# Patient Record
Sex: Female | Born: 1950 | Race: White | Hispanic: No | Marital: Married | Smoking: Current every day smoker
Health system: Southern US, Community
[De-identification: ages and names within clinical notes are randomized; demographics above are authoritative.]

## PROBLEM LIST (undated history)

## (undated) DIAGNOSIS — M8580 Other specified disorders of bone density and structure, unspecified site: Secondary | ICD-10-CM

## (undated) DIAGNOSIS — N200 Calculus of kidney: Secondary | ICD-10-CM

## (undated) DIAGNOSIS — E7800 Pure hypercholesterolemia, unspecified: Secondary | ICD-10-CM

## (undated) DIAGNOSIS — T8859XA Other complications of anesthesia, initial encounter: Secondary | ICD-10-CM

## (undated) DIAGNOSIS — F329 Major depressive disorder, single episode, unspecified: Secondary | ICD-10-CM

## (undated) DIAGNOSIS — E119 Type 2 diabetes mellitus without complications: Secondary | ICD-10-CM

## (undated) DIAGNOSIS — T4145XA Adverse effect of unspecified anesthetic, initial encounter: Secondary | ICD-10-CM

## (undated) DIAGNOSIS — Z9889 Other specified postprocedural states: Secondary | ICD-10-CM

## (undated) DIAGNOSIS — R112 Nausea with vomiting, unspecified: Secondary | ICD-10-CM

## (undated) DIAGNOSIS — I739 Peripheral vascular disease, unspecified: Secondary | ICD-10-CM

## (undated) DIAGNOSIS — I10 Essential (primary) hypertension: Secondary | ICD-10-CM

## (undated) HISTORY — PX: DILATION AND CURETTAGE OF UTERUS: SHX78

## (undated) HISTORY — DX: Other specified disorders of bone density and structure, unspecified site: M85.80

---
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
## 1979-07-07 HISTORY — PX: BREAST BIOPSY: SHX20

## 1979-07-07 HISTORY — PX: BREAST CYST EXCISION: SHX579

## 1993-07-06 DIAGNOSIS — F32A Depression, unspecified: Secondary | ICD-10-CM

## 1993-07-06 HISTORY — DX: Depression, unspecified: F32.A

## 1995-07-07 HISTORY — PX: ANTERIOR CERVICAL DECOMP/DISCECTOMY FUSION: SHX1161

## 1999-08-27 ENCOUNTER — Other Ambulatory Visit: Admission: RE | Admit: 1999-08-27 | Discharge: 1999-08-27 | Payer: Self-pay | Admitting: Obstetrics and Gynecology

## 2000-10-19 ENCOUNTER — Other Ambulatory Visit: Admission: RE | Admit: 2000-10-19 | Discharge: 2000-10-19 | Payer: Self-pay | Admitting: Obstetrics and Gynecology

## 2001-01-12 ENCOUNTER — Other Ambulatory Visit: Admission: RE | Admit: 2001-01-12 | Discharge: 2001-01-12 | Payer: Self-pay | Admitting: Obstetrics and Gynecology

## 2001-01-12 ENCOUNTER — Encounter (INDEPENDENT_AMBULATORY_CARE_PROVIDER_SITE_OTHER): Payer: Self-pay | Admitting: Specialist

## 2002-02-01 ENCOUNTER — Other Ambulatory Visit: Admission: RE | Admit: 2002-02-01 | Discharge: 2002-02-01 | Payer: Self-pay | Admitting: Obstetrics and Gynecology

## 2002-09-04 ENCOUNTER — Ambulatory Visit (HOSPITAL_COMMUNITY): Admission: RE | Admit: 2002-09-04 | Discharge: 2002-09-04 | Payer: Self-pay | Admitting: Obstetrics and Gynecology

## 2002-09-04 ENCOUNTER — Encounter: Payer: Self-pay | Admitting: Obstetrics and Gynecology

## 2002-09-12 ENCOUNTER — Encounter: Admission: RE | Admit: 2002-09-12 | Discharge: 2002-12-11 | Payer: Self-pay | Admitting: Family Medicine

## 2002-11-06 ENCOUNTER — Encounter: Payer: Self-pay | Admitting: Family Medicine

## 2002-11-06 ENCOUNTER — Encounter: Admission: RE | Admit: 2002-11-06 | Discharge: 2002-11-06 | Payer: Self-pay | Admitting: Family Medicine

## 2002-11-09 ENCOUNTER — Encounter: Admission: RE | Admit: 2002-11-09 | Discharge: 2002-11-09 | Payer: Self-pay | Admitting: Family Medicine

## 2002-11-09 ENCOUNTER — Encounter: Payer: Self-pay | Admitting: Family Medicine

## 2003-03-07 ENCOUNTER — Other Ambulatory Visit: Admission: RE | Admit: 2003-03-07 | Discharge: 2003-03-07 | Payer: Self-pay | Admitting: Obstetrics and Gynecology

## 2003-08-30 ENCOUNTER — Ambulatory Visit (HOSPITAL_BASED_OUTPATIENT_CLINIC_OR_DEPARTMENT_OTHER): Admission: RE | Admit: 2003-08-30 | Discharge: 2003-08-30 | Payer: Self-pay | Admitting: Obstetrics and Gynecology

## 2003-08-30 ENCOUNTER — Ambulatory Visit (HOSPITAL_COMMUNITY): Admission: RE | Admit: 2003-08-30 | Discharge: 2003-08-30 | Payer: Self-pay | Admitting: Obstetrics and Gynecology

## 2003-08-30 ENCOUNTER — Encounter (INDEPENDENT_AMBULATORY_CARE_PROVIDER_SITE_OTHER): Payer: Self-pay | Admitting: Specialist

## 2007-03-10 ENCOUNTER — Other Ambulatory Visit: Admission: RE | Admit: 2007-03-10 | Discharge: 2007-03-10 | Payer: Self-pay | Admitting: Obstetrics and Gynecology

## 2009-01-25 ENCOUNTER — Encounter: Admission: RE | Admit: 2009-01-25 | Discharge: 2009-01-25 | Payer: Self-pay | Admitting: Otolaryngology

## 2009-04-29 ENCOUNTER — Emergency Department (HOSPITAL_COMMUNITY): Admission: EM | Admit: 2009-04-29 | Discharge: 2009-04-30 | Payer: Self-pay | Admitting: Emergency Medicine

## 2009-07-02 ENCOUNTER — Other Ambulatory Visit: Admission: RE | Admit: 2009-07-02 | Discharge: 2009-07-02 | Payer: Self-pay | Admitting: Obstetrics and Gynecology

## 2009-07-02 ENCOUNTER — Ambulatory Visit: Payer: Self-pay | Admitting: Obstetrics and Gynecology

## 2009-09-02 ENCOUNTER — Ambulatory Visit: Payer: Self-pay | Admitting: Obstetrics and Gynecology

## 2010-02-05 ENCOUNTER — Emergency Department (HOSPITAL_COMMUNITY): Admission: EM | Admit: 2010-02-05 | Discharge: 2010-02-05 | Payer: Self-pay | Admitting: Emergency Medicine

## 2010-02-24 ENCOUNTER — Ambulatory Visit (HOSPITAL_COMMUNITY): Admission: RE | Admit: 2010-02-24 | Discharge: 2010-02-24 | Payer: Self-pay | Admitting: Urology

## 2010-07-06 DIAGNOSIS — N200 Calculus of kidney: Secondary | ICD-10-CM

## 2010-07-06 HISTORY — PX: LITHOTRIPSY: SUR834

## 2010-07-06 HISTORY — DX: Calculus of kidney: N20.0

## 2010-07-27 ENCOUNTER — Encounter: Payer: Self-pay | Admitting: Urology

## 2010-09-18 LAB — GLUCOSE, CAPILLARY: Glucose-Capillary: 100 mg/dL — ABNORMAL HIGH (ref 70–99)

## 2010-09-19 LAB — CBC
HCT: 38.4 % (ref 36.0–46.0)
Hemoglobin: 13.3 g/dL (ref 12.0–15.0)
MCHC: 34.7 g/dL (ref 30.0–36.0)
RBC: 4.11 MIL/uL (ref 3.87–5.11)

## 2010-09-19 LAB — DIFFERENTIAL
Basophils Relative: 1 % (ref 0–1)
Lymphs Abs: 1 10*3/uL (ref 0.7–4.0)
Monocytes Absolute: 0.5 10*3/uL (ref 0.1–1.0)
Monocytes Relative: 5 % (ref 3–12)
Neutro Abs: 9.8 10*3/uL — ABNORMAL HIGH (ref 1.7–7.7)

## 2010-09-19 LAB — URINE MICROSCOPIC-ADD ON

## 2010-09-19 LAB — URINALYSIS, ROUTINE W REFLEX MICROSCOPIC
Glucose, UA: 250 mg/dL — AB
Ketones, ur: 15 mg/dL — AB
Nitrite: NEGATIVE
Specific Gravity, Urine: 1.021 (ref 1.005–1.030)
pH: 6.5 (ref 5.0–8.0)

## 2010-09-19 LAB — BASIC METABOLIC PANEL
CO2: 23 mEq/L (ref 19–32)
GFR calc Af Amer: >60 mL/min (ref >60)
GFR calc non Af Amer: 56 mL/min — ABNORMAL LOW (ref >60)
Glucose, Bld: 226 mg/dL — ABNORMAL HIGH (ref 70–99)
Potassium: 4.5 mEq/L (ref 3.5–5.1)
Sodium: 137 mEq/L (ref 135–145)

## 2010-09-19 LAB — GLUCOSE, CAPILLARY

## 2010-11-21 NOTE — Op Note (Signed)
NAME:  Yvette Graham, Yvette Graham                         ACCOUNT NO.:  [id number]   MEDICAL RECORD NO.:  [id number]                   PATIENT TYPE:  AMB   LOCATION:  [facility]                                 FACILITY:  [facility]   PHYSICIAN:  Daniel L. Eda Paschal, M.D.           DATE OF BIRTH:  Nov 25, 1950   DATE OF PROCEDURE:  08/30/2003  DATE OF DISCHARGE:                                 OPERATIVE REPORT   PREOPERATIVE DIAGNOSES:  1. Postmenopausal bleeding.  2. Endometrial polyp.   POSTOPERATIVE DIAGNOSES:  1. Postmenopausal bleeding.  2. Endometrial polyp.   OPERATION/PROCEDURE:  Hysterectomy and dilatation and curettage.   SURGEON:  Daniel L. Eda Paschal, M.D.   ANESTHESIA:  General.   INDICATIONS:  The patient is a 60 year old female who has been  postmenopausal for a year with an elevated FSH, secondary amenorrhea. Then  about eight weeks ago she started to bleed and she has been bleeding ever  since.  Saline infusion histogram in the office showed several intrauterine  cavitary defects.  She enters the hospital now for hysteroscopy and  dilatation and curettage for the above findings.  External and vaginal was  normal.  Cervix is clean.  Uterus is retroverted, normal size and shape.  There is possibly first degree uterine descensus.  Adnexa are palpably  normal.  At the time of hysteroscopy, the patient had multiple small  endometrial polyps.  None were larger than 1 cm.  They were on both lateral  fundal walls.  Once they had been removed, top of the fundus, anterior and  posterior walls of the fundus, tubal ostia, lower uterine segment, and the  cervical canal were all free of disease.   DESCRIPTION OF PROCEDURE:  After adequate general anesthesia, the patient  was placed in the dorsal lithotomy position, prepped and draped in the usual  sterile manner.  A single-tooth tenaculum was placed in the anterior lip of  the cervix.  The cervix was dilated to a #33 Pratt dilator.  A  hysteroscopic  resectoscope was utilized using 3% sorbitol to expand the intrauterine  cavity and the camera for magnification.  Initially there was some  compromise of the view.  Because there was a fair bit of loose fragments in  the endometrium, some of which were probably blood from the recent bleeding,  some of which appeared to be fragments of a polyp.  Therefore, the  hysteroscope was removed and a sharp curettage was done, removing the above  tissue.  The hysteroscope was then reinserted.  A much better view could now  be obtained as the cavity was cleaner.  Using a 90-degree wire loupe with  setting of coag 110 cutting, the several fragments of polypoid tissue that  were still left were excised.  There was now no bleeding.  The procedure was  terminated.  Blood loss was less than 25 mL.  Fluid deficit was 50 mL.  The  patient tolerated  the procedure well and left the operating room in  satisfactory condition.                                               Daniel L. Eda Paschal, M.D.   Tonette Bihari  D:  08/30/2003  T:  08/30/2003  Job:  [id number]

## 2011-06-28 IMAGING — CT CT ABD-PELV W/O CM
2 of 4 series · 17 of 46 positions shown, 19 images · non-contrast
Comparison: None

CLINICAL DATA: Flank pain

CT ABDOMEN AND PELVIS WITHOUT CONTRAST
TECHNIQUE: Multidetector CT imaging of the abdomen and pelvis was
performed following the standard protocol without intravenous
contrast.

[Series 2: under 200# stone no prev · axial · 0.60mm/px · z∈[+760,+1090]mm · 14 of 72 slices shown, 16 images]
[im 3/72  soft-tissue]
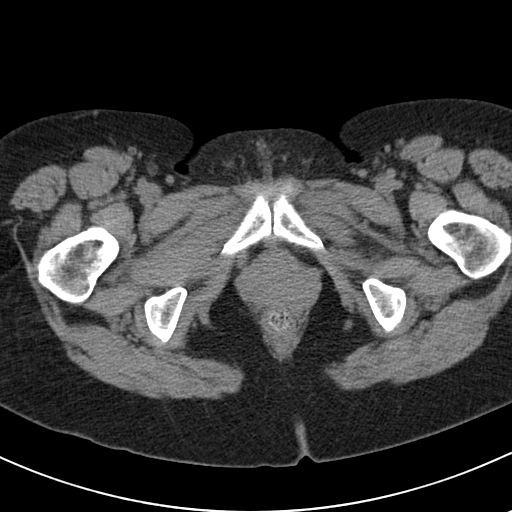
[im 3/72  bone]
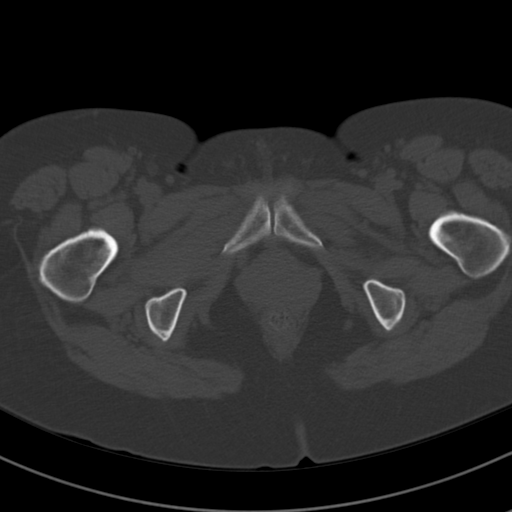
[im 9/72  soft-tissue]
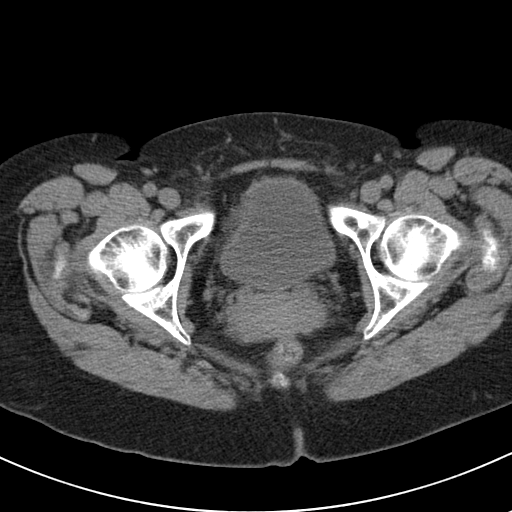
[im 15/72  soft-tissue]
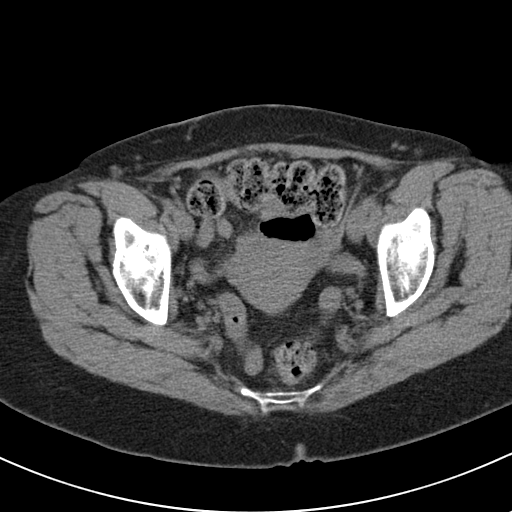
[im 18/72  soft-tissue]
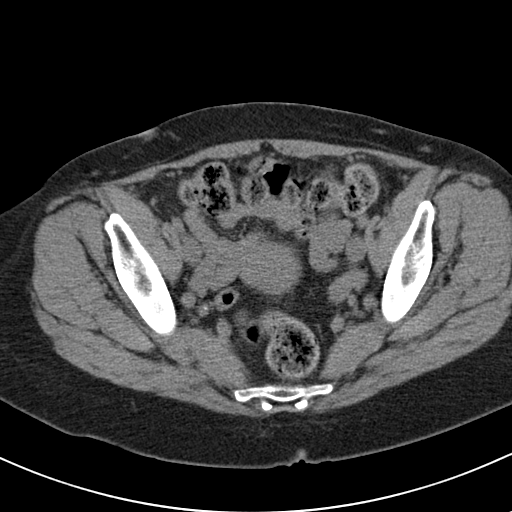
[im 24/72  soft-tissue]
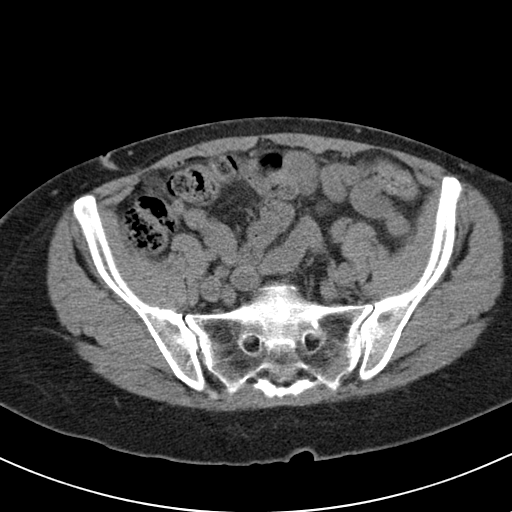
[im 30/72  soft-tissue]
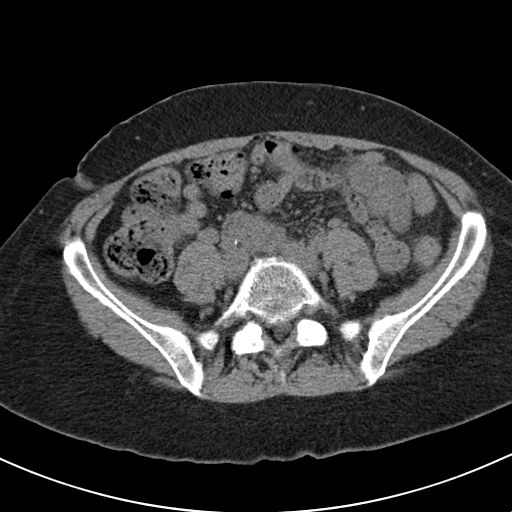
[im 33/72  soft-tissue]
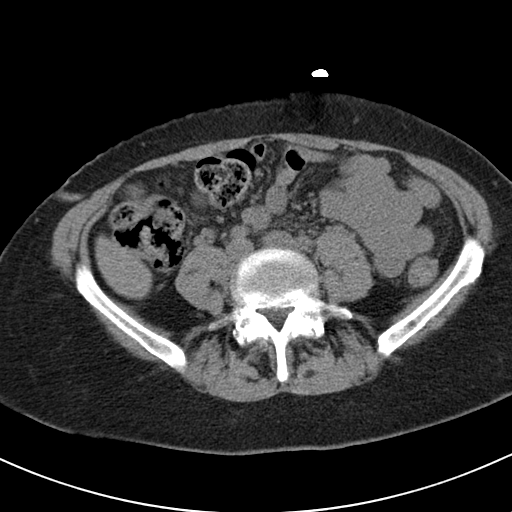
[im 39/72  soft-tissue]
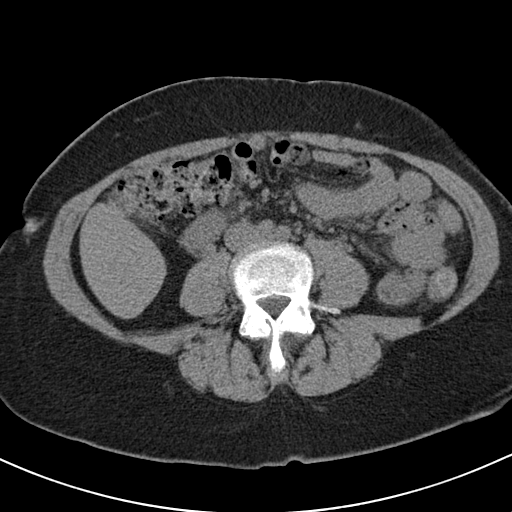
[im 42/72  soft-tissue]
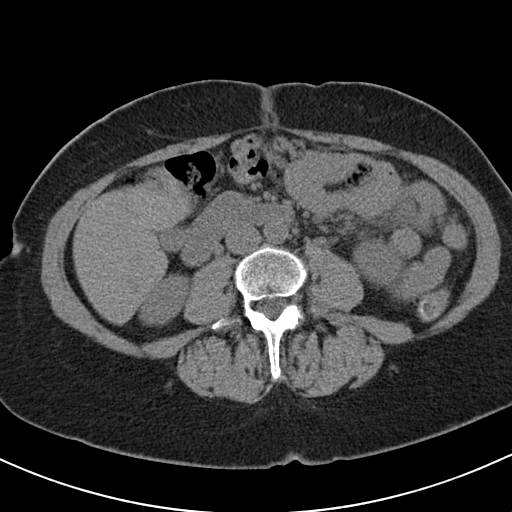
[im 42/72  bone]
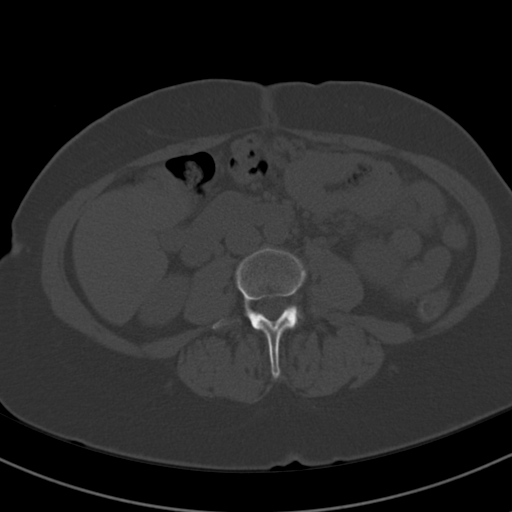
[im 48/72  soft-tissue]
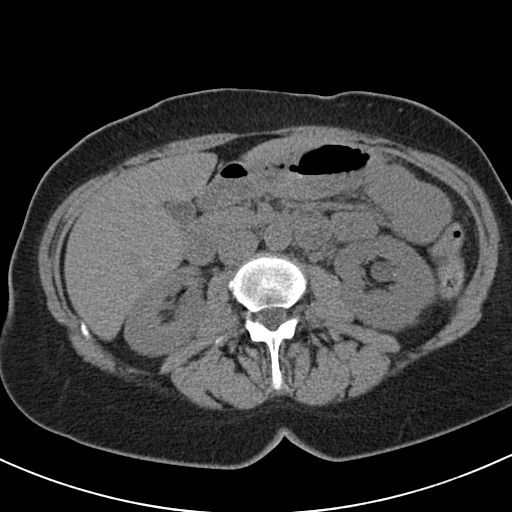
[im 54/72  soft-tissue]
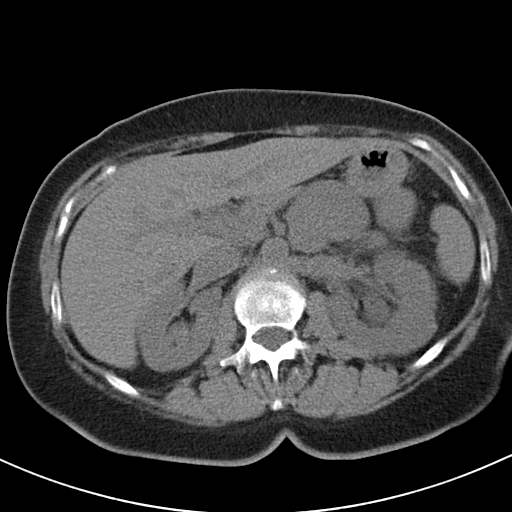
[im 57/72  soft-tissue]
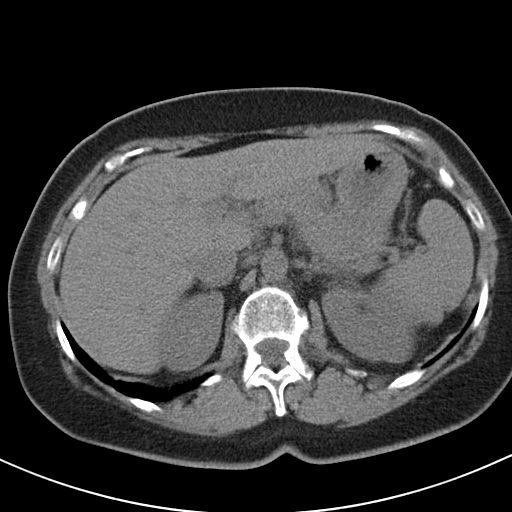
[im 63/72  soft-tissue]
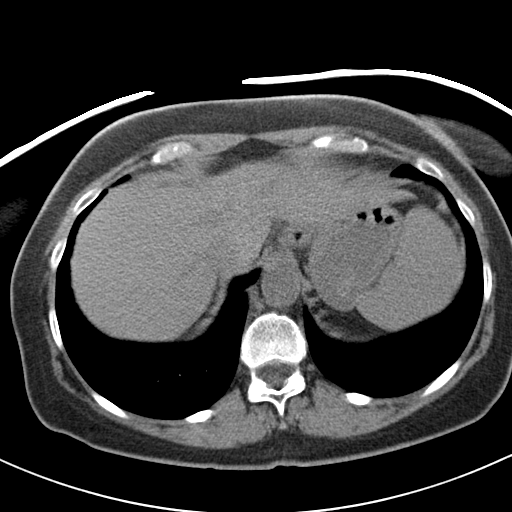
[im 69/72  soft-tissue]
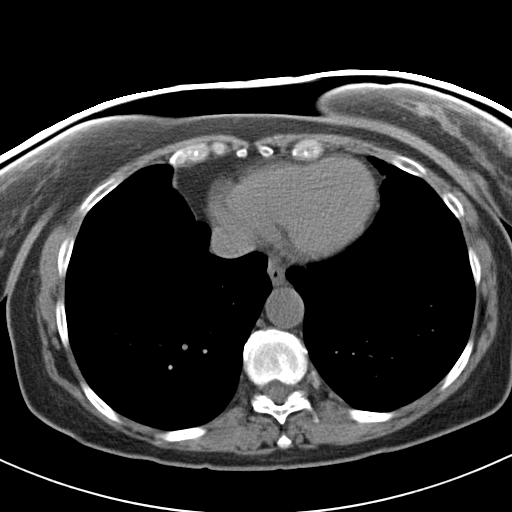

[Series 602: <mpr thick range> · coronal · 0.74mm/px · 3 of 64 slices shown]
[im 22/64  soft-tissue]
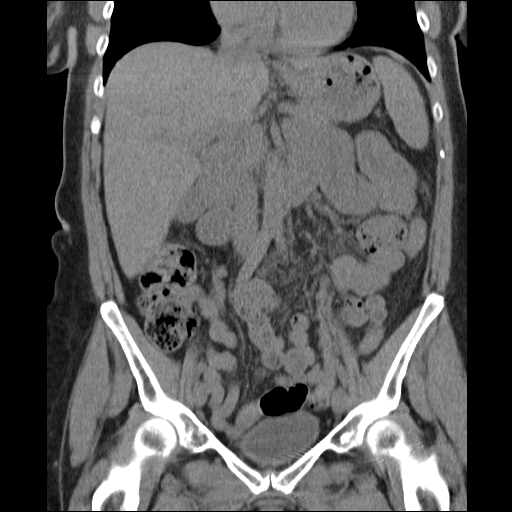
[im 29/64  soft-tissue]
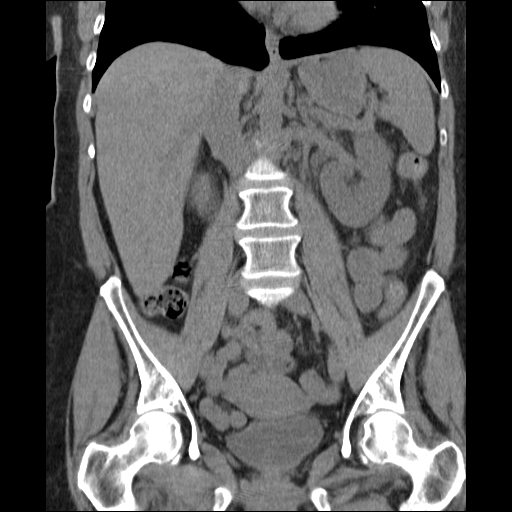
[im 36/64  soft-tissue]
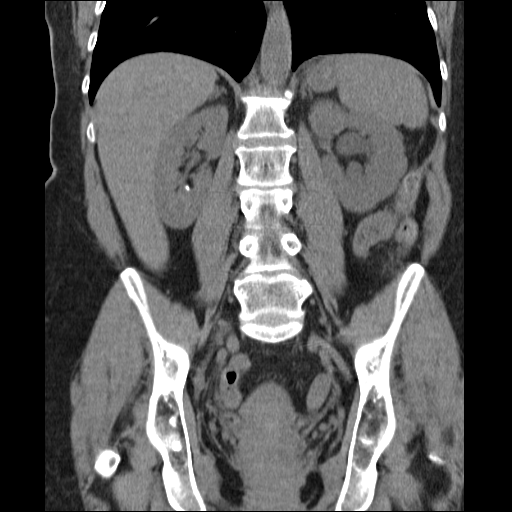

[17 of 46 positions shown; findings below may reference images not displayed]

FINDINGS: The lung bases appear clear.

No focal liver abnormalities identified.

The spleen is normal.

The adrenal glands are normal.

Pancreas appears normal.

There are two small nonobstructing stones within the inferior pole
collecting system of the right kidney which measure 3 mm. There are
several punctate stones within the collecting system of the left
kidney.  Moderate left hydronephrosis and hydroureter is noted.  At
the left UPJ there is a 5.7 mm stone.

There is no enlarged upper abdominal lymph nodes.

There is no free fluid or abnormal fluid collections identified.

The upper abdominal bowel loops have a normal course and caliber.
There is no evidence for obstruction.

There is no free fluid within the pelvis.  The urinary bladder is
normal.  The uterus and adnexal structures are unremarkable.

The urinary bladder appears normal.

Review of the visualized osseous structures shows no aggressive
bone lesions.  There is multilevel degenerative disc disease.
IMPRESSION: 1.  Left-sided hydronephrosis secondary to 5.7 mm left UPJ stone.
2.  Bilateral nephrolithiasis.

## 2011-11-16 ENCOUNTER — Ambulatory Visit (INDEPENDENT_AMBULATORY_CARE_PROVIDER_SITE_OTHER): Payer: BC Managed Care – PPO | Admitting: Vascular Surgery

## 2011-11-16 ENCOUNTER — Emergency Department (HOSPITAL_COMMUNITY)
Admission: EM | Admit: 2011-11-16 | Discharge: 2011-11-16 | Disposition: A | Discharge status: Home / Self Care | Payer: BC Managed Care – PPO | Attending: Emergency Medicine | Admitting: Emergency Medicine

## 2011-11-16 ENCOUNTER — Emergency Department (HOSPITAL_COMMUNITY): Payer: BC Managed Care – PPO

## 2011-11-16 DIAGNOSIS — R112 Nausea with vomiting, unspecified: Secondary | ICD-10-CM | POA: Insufficient documentation

## 2011-11-16 DIAGNOSIS — Z794 Long term (current) use of insulin: Secondary | ICD-10-CM | POA: Insufficient documentation

## 2011-11-16 DIAGNOSIS — R55 Syncope and collapse: Secondary | ICD-10-CM

## 2011-11-16 DIAGNOSIS — R259 Unspecified abnormal involuntary movements: Secondary | ICD-10-CM | POA: Insufficient documentation

## 2011-11-16 DIAGNOSIS — R4182 Altered mental status, unspecified: Secondary | ICD-10-CM | POA: Insufficient documentation

## 2011-11-16 DIAGNOSIS — Z7982 Long term (current) use of aspirin: Secondary | ICD-10-CM | POA: Insufficient documentation

## 2011-11-16 DIAGNOSIS — R42 Dizziness and giddiness: Secondary | ICD-10-CM | POA: Insufficient documentation

## 2011-11-16 DIAGNOSIS — F29 Unspecified psychosis not due to a substance or known physiological condition: Secondary | ICD-10-CM | POA: Insufficient documentation

## 2011-11-16 DIAGNOSIS — R51 Headache: Secondary | ICD-10-CM | POA: Insufficient documentation

## 2011-11-16 DIAGNOSIS — Z79899 Other long term (current) drug therapy: Secondary | ICD-10-CM | POA: Insufficient documentation

## 2011-11-16 LAB — CBC
HCT: 39.7 % (ref 36.0–46.0)
MCV: 91.9 fL (ref 78.0–100.0)
RBC: 4.32 MIL/uL (ref 3.87–5.11)
RDW: 13 % (ref 11.5–15.5)
WBC: 7.1 10*3/uL (ref 4.0–10.5)

## 2011-11-16 LAB — COMPREHENSIVE METABOLIC PANEL
ALT: 18 U/L (ref 0–35)
AST: 24 U/L (ref 0–37)
CO2: 24 mEq/L (ref 19–32)
Calcium: 9.5 mg/dL (ref 8.4–10.5)
Chloride: 105 mEq/L (ref 96–112)
Creatinine, Ser: 0.63 mg/dL (ref 0.50–1.10)
GFR calc Af Amer: >90 mL/min (ref >90)
GFR calc non Af Amer: >90 mL/min (ref >90)
Glucose, Bld: 120 mg/dL — ABNORMAL HIGH (ref 70–99)
Sodium: 140 mEq/L (ref 135–145)
Total Bilirubin: 0.3 mg/dL (ref 0.3–1.2)

## 2011-11-16 LAB — URINE MICROSCOPIC-ADD ON

## 2011-11-16 LAB — URINALYSIS, ROUTINE W REFLEX MICROSCOPIC
Bilirubin Urine: NEGATIVE
Glucose, UA: NEGATIVE mg/dL
Hgb urine dipstick: NEGATIVE
Specific Gravity, Urine: 1.007 (ref 1.005–1.030)

## 2011-11-16 LAB — DIFFERENTIAL
Basophils Absolute: 0 10*3/uL (ref 0.0–0.1)
Eosinophils Relative: 1 % (ref 0–5)
Lymphocytes Relative: 18 % (ref 12–46)
Lymphs Abs: 1.3 10*3/uL (ref 0.7–4.0)
Monocytes Absolute: 0.5 10*3/uL (ref 0.1–1.0)
Neutro Abs: 5.2 10*3/uL (ref 1.7–7.7)

## 2011-11-16 LAB — APTT: aPTT: 29 seconds (ref 24–37)

## 2011-11-16 MED ORDER — ASPIRIN EC 325 MG PO TBEC: 325.0000 mg | DELAYED_RELEASE_TABLET | Freq: Every day | ORAL | Status: AC

## 2011-11-16 NOTE — ED Provider Notes (Signed)
History     CSN: [id number]  Arrival date & time 11/16/11  0705   First MD Initiated Contact with Patient 11/16/11 [phone]      Chief Complaint  Patient presents with  . Stroke Symptoms    (Consider location/radiation/quality/duration/timing/severity/associated sxs/prior treatment) The history is provided by the patient.   patient states that she woke up at 5:30 this morning confused stumbling and dizzy. She states she looked over at her husband but did not recognize him. She states she was unable to walk. She states nausea came later. She did have a headache. Patient now feels much better. She had a episode of similar symptoms on Friday. She states that time she was incontinent of stool. She does not remember most of the day on Friday. She saw her primary care Dr. Neale Burly, U. ordered lab work and an MRI. The MRI was reportedly negative. The family states that he was going to arrange to get her carotids imaged by vascular surgery. Her blood sugar was in the 80s when checked during the episode. No fevers. No localizing numbness or weakness. She was on the steady on her feet.  No past medical history on file.  No past surgical history on file.  No family history on file.  History  Substance Use Topics  . Smoking status: Not on file  . Smokeless tobacco: Not on file  . Alcohol Use: Not on file    OB History    No data available      Review of Systems  Constitutional: Negative for activity change and appetite change.  HENT: Negative for neck stiffness.   Eyes: Negative for pain.  Respiratory: Negative for chest tightness and shortness of breath.   Cardiovascular: Negative for chest pain and leg swelling.  Gastrointestinal: Positive for nausea and vomiting. Negative for abdominal pain and diarrhea.  Genitourinary: Negative for flank pain.  Musculoskeletal: Negative for back pain.  Skin: Negative for rash.  Neurological: Positive for dizziness and headaches. Negative for speech  difficulty, weakness and numbness.  Psychiatric/Behavioral: Positive for confusion. Negative for behavioral problems.    Allergies  Codeine  Home Medications   Current Outpatient Rx  Name Route Sig Dispense Refill  . VYTORIN PO Oral Take 1 tablet by mouth at bedtime. Dose is 20/40    . INSULIN GLARGINE 100 UNIT/ML [place] SOLN Subcutaneous Inject 20 Units into the skin at bedtime.    . INSULIN LISPRO (HUMAN) 100 UNIT/ML [place] SOLN Subcutaneous Inject 6-22 Units into the skin 3 (three) times daily before meals. Sliding scale    . LISINOPRIL 10 MG PO TABS Oral Take 10 mg by mouth at bedtime.    Marland Kitchen PIOGLITAZONE HCL 30 MG PO TABS Oral Take 30 mg by mouth at bedtime.    . ASPIRIN EC 325 MG PO TBEC Oral Take 1 tablet (325 mg total) by mouth daily. 30 tablet 0    BP 130/54  Pulse 70  Temp(Src) 97.8 F (36.6 C) (Oral)  Resp 16  SpO2 97%  Physical Exam  Nursing note and vitals reviewed. Constitutional: She is oriented to person, place, and time. She appears well-developed and well-nourished.  HENT:  Head: Normocephalic and atraumatic.  Eyes: EOM are normal. Pupils are equal, round, and reactive to light.  Neck: Normal range of motion. Neck supple.       No carotid bruits by auscultation  Cardiovascular: Normal rate, regular rhythm and normal heart sounds.   No murmur heard. Pulmonary/Chest: Effort normal and breath sounds normal.  No respiratory distress. She has no wheezes. She has no rales.  Abdominal: Soft. Bowel sounds are normal. She exhibits no distension. There is no tenderness. There is no rebound and no guarding.  Musculoskeletal: Normal range of motion.  Neurological: She is alert and oriented to person, place, and time. No cranial nerve deficit.       Grip strength is equal bilaterally. Patient does have a little bit of a tremor, but patient states is because she is cold. Finger-nose and heel shin are intact bilaterally.  Skin: Skin is warm and dry.  Psychiatric: She has a normal  mood and affect. Her speech is normal.    ED Course  Procedures (including critical care time)  Labs Reviewed  COMPREHENSIVE METABOLIC PANEL - Abnormal; Notable for the following:    Glucose, Bld 120 (*)    All other components within normal limits  URINALYSIS, ROUTINE W REFLEX MICROSCOPIC - Abnormal; Notable for the following:    Leukocytes, UA TRACE (*)    All other components within normal limits  PROTIME-INR  APTT  CBC  DIFFERENTIAL  CK TOTAL AND CKMB  TROPONIN I  URINE MICROSCOPIC-ADD ON   Dg Chest 2 View  11/16/2011  *RADIOLOGY REPORT*  Clinical Data: Confusion  CHEST - 2 VIEW  Comparison: None.  Findings: Normal heart size.  Clear lungs.  Increased AP diameter of the chest.  Bronchitic changes.  No pneumothorax and no pleural effusion.  IMPRESSION: Findings related to COPD are noted but there is no active cardiopulmonary disease.  Original Report Authenticated By: Donavan Burnet, M.D.     1. Altered mental status     Date: 11/16/2011  Rate: 74  Rhythm: normal sinus rhythm  QRS Axis: normal  Intervals: normal  ST/T Wave abnormalities: normal  Conduction Disutrbances:none  Narrative Interpretation:   Old EKG Reviewed: unchanged     MDM  Patient presented today with a second episode of altered mental status. She was at home and had dizziness confusion. Blood pressure was reportedly initially high. Patient is back to her baseline now. Her sugar was not low with the event. She was seen by her primary care Dr. Neale Burly on Friday and had an MRI. I discussed with Dr. Jarold Motto, who reviewed the results with me. After discussion with Dr. Jarold Motto and Dr. Elita Boone, the neurologist on call. Decision was made to have the patient followup for a carotid duplex as an outpatient as planned. She'll also follow with neurology. Her aspirin will be increased to a full dose. Patient was instructed not to drive since seizures still on the differential.        Juliet Rude. Rubin Payor,  MD 11/16/11 [phone]

## 2011-11-16 NOTE — Discharge Instructions (Signed)
Altered Mental Status Altered mental status most often refers to an abnormal change in your responsiveness and awareness. It can affect your speech, thought, mobility, memory, attention span, or alertness. It can range from slight confusion to complete unresponsiveness (coma). Altered mental status can be a sign of a serious underlying medical condition. Rapid evaluation and medical treatment is necessary for patients having an altered mental status. CAUSES   Low blood sugar (hypoglycemia) or diabetes.   Severe loss of body fluids (dehydration) or a body salt (electrolyte) imbalance.   A stroke or other neurologic problem, such as dementia or delirium.   A head injury or tumor.   A drug or alcohol overdose.   Exposure to toxins or poisons.   Depression, anxiety, and stress.   A low oxygen level (hypoxia).   An infection.   Blood loss.   Twitching or shaking (seizure).   Heart problems, such as heart attack or heart rhythm problems (arrhythmias).   A body temperature that is too low or too high (hypothermia or hyperthermia).  DIAGNOSIS  A diagnosis is based on your history, symptoms, physical and neurologic examinations, and diagnostic tests. Diagnostic tests may include:  Measurement of your blood pressure, pulse, breathing, and oxygen levels (vital signs).   Blood tests.   Urine tests.   X-ray exams.   A computerized magnetic scan (magnetic resonance imaging, MRI).   A computerized X-ray scan (computed tomography, CT scan).  TREATMENT  Treatment will depend on the cause. Treatment may include:  Management of an underlying medical or mental health condition.   Critical care or support in the hospital.  HOME CARE INSTRUCTIONS   Only take over-the-counter or prescription medicines for pain, discomfort, or fever as directed by your caregiver.   Manage underlying conditions as directed by your caregiver.   Eat a healthy, well-balanced diet to maintain strength.    Join a support group or prevention program to cope with the condition or trauma that caused the altered mental status. Ask your caregiver to help choose a program that works for you.   Follow up with your caregiver for further examination, therapy, or testing as directed.  SEEK MEDICAL CARE IF:   You feel unwell or have chills.   You or your family notice a change in your behavior or your alertness.   You have trouble following your caregiver's treatment plan.   You have questions or concerns.  SEEK IMMEDIATE MEDICAL CARE IF:   You have a rapid heartbeat or have chest pain.   You have difficulty breathing.   You have a fever.   You have a headache with a stiff neck.   You cough up blood.   You have blood in your urine or stool.   You have severe agitation or confusion.  MAKE SURE YOU:   Understand these instructions.   Will watch your condition.   Will get help right away if you are not doing well or get worse.  Document Released: 12/10/2009 Document Revised: 06/11/2011 Document Reviewed: 12/10/2009 Exit[employer] Patient Information 2012 [employer]. 

## 2011-11-16 NOTE — ED Notes (Signed)
Pt awoke this morning at 05:30 with confusion and stumbling. She did not know who her husband. Same symptoms as last Friday. Dr. Is supposed to be setting up an appointment to have carotids looked at. C/o Headache, high blood pressure dizziness, Unsteady on her feet. Currently Alert oriented. Last seen normal 23:00 last night.

## 2011-11-17 ENCOUNTER — Encounter (HOSPITAL_COMMUNITY): Payer: Self-pay

## 2011-11-17 ENCOUNTER — Observation Stay (HOSPITAL_COMMUNITY)
Admission: EM | Admit: 2011-11-17 | Discharge: 2011-11-19 | Disposition: A | Discharge status: Home / Self Care | Payer: BC Managed Care – PPO | Source: Ambulatory Visit | Attending: Internal Medicine | Admitting: Internal Medicine

## 2011-11-17 DIAGNOSIS — R4182 Altered mental status, unspecified: Principal | ICD-10-CM | POA: Insufficient documentation

## 2011-11-17 DIAGNOSIS — E119 Type 2 diabetes mellitus without complications: Secondary | ICD-10-CM | POA: Insufficient documentation

## 2011-11-17 DIAGNOSIS — E785 Hyperlipidemia, unspecified: Secondary | ICD-10-CM | POA: Insufficient documentation

## 2011-11-17 DIAGNOSIS — E7800 Pure hypercholesterolemia, unspecified: Secondary | ICD-10-CM | POA: Insufficient documentation

## 2011-11-17 DIAGNOSIS — I10 Essential (primary) hypertension: Secondary | ICD-10-CM | POA: Insufficient documentation

## 2011-11-17 DIAGNOSIS — R159 Full incontinence of feces: Secondary | ICD-10-CM | POA: Insufficient documentation

## 2011-11-17 DIAGNOSIS — R32 Unspecified urinary incontinence: Secondary | ICD-10-CM | POA: Insufficient documentation

## 2011-11-17 HISTORY — DX: Type 2 diabetes mellitus without complications: E11.9

## 2011-11-17 HISTORY — DX: Other specified postprocedural states: Z98.890

## 2011-11-17 HISTORY — DX: Adverse effect of unspecified anesthetic, initial encounter: T41.45XA

## 2011-11-17 HISTORY — DX: Calculus of kidney: N20.0

## 2011-11-17 HISTORY — DX: Peripheral vascular disease, unspecified: I73.9

## 2011-11-17 HISTORY — DX: Nausea with vomiting, unspecified: R11.2

## 2011-11-17 HISTORY — DX: Pure hypercholesterolemia, unspecified: E78.00

## 2011-11-17 HISTORY — DX: Essential (primary) hypertension: I10

## 2011-11-17 HISTORY — DX: Major depressive disorder, single episode, unspecified: F32.9

## 2011-11-17 HISTORY — DX: Other complications of anesthesia, initial encounter: T88.59XA

## 2011-11-17 LAB — COMPREHENSIVE METABOLIC PANEL
Albumin: 3.6 g/dL (ref 3.5–5.2)
Alkaline Phosphatase: 82 U/L (ref 39–117)
BUN: 12 mg/dL (ref 6–23)
Calcium: 9.7 mg/dL (ref 8.4–10.5)
GFR calc Af Amer: >90 mL/min (ref >90)
Potassium: 3.9 mEq/L (ref 3.5–5.1)
Sodium: 142 mEq/L (ref 135–145)
Total Protein: 6.6 g/dL (ref 6.0–8.3)

## 2011-11-17 LAB — RAPID URINE DRUG SCREEN, HOSP PERFORMED
Amphetamines: NOT DETECTED
Tetrahydrocannabinol: NOT DETECTED

## 2011-11-17 LAB — CBC
HCT: 38.3 % (ref 36.0–46.0)
Hemoglobin: 13.1 g/dL (ref 12.0–15.0)
MCH: 31.5 pg (ref 26.0–34.0)
MCH: 31.1 pg (ref 26.0–34.0)
MCHC: 34.5 g/dL (ref 30.0–36.0)
MCV: 91.1 fL (ref 78.0–100.0)
MCV: 91 fL (ref 78.0–100.0)
Platelets: 237 10*3/uL (ref 150–400)
RBC: 4.21 MIL/uL (ref 3.87–5.11)

## 2011-11-17 LAB — GLUCOSE, CAPILLARY
Glucose-Capillary: 52 mg/dL — ABNORMAL LOW (ref 70–99)
Glucose-Capillary: 77 mg/dL (ref 70–99)
Glucose-Capillary: 291 mg/dL — ABNORMAL HIGH (ref 70–99)

## 2011-11-17 LAB — DIFFERENTIAL
Basophils Relative: 0 % (ref 0–1)
Eosinophils Absolute: 0.1 10*3/uL (ref 0.0–0.7)
Eosinophils Relative: 1 % (ref 0–5)
Monocytes Relative: 6 % (ref 3–12)
Neutrophils Relative %: 80 % — ABNORMAL HIGH (ref 43–77)

## 2011-11-17 LAB — CREATININE, SERUM: Creatinine, Ser: 0.61 mg/dL (ref 0.50–1.10)

## 2011-11-17 MED ORDER — PIOGLITAZONE HCL 30 MG PO TABS
30.0000 mg | ORAL_TABLET | Freq: Every day | ORAL | Status: DC
  Administered 2011-11-18 – 2011-11-19 (×2): 30 mg via ORAL
  Filled 2011-11-17 (×3): qty 1

## 2011-11-17 MED ORDER — INSULIN ASPART 100 UNIT/ML ~~LOC~~ SOLN
0.0000 [IU] | Freq: Three times a day (TID) | SUBCUTANEOUS | Status: DC
  Administered 2011-11-17: 8 [IU] via SUBCUTANEOUS

## 2011-11-17 MED ORDER — SODIUM CHLORIDE 0.9 % IJ SOLN: 3.0000 mL | INTRAMUSCULAR | Status: DC | PRN

## 2011-11-17 MED ORDER — ACETAMINOPHEN 325 MG PO TABS
650.0000 mg | ORAL_TABLET | Freq: Four times a day (QID) | ORAL | Status: DC | PRN
  Administered 2011-11-17: 650 mg via ORAL
  Filled 2011-11-17: qty 2

## 2011-11-17 MED ORDER — SENNOSIDES-DOCUSATE SODIUM 8.6-50 MG PO TABS
1.0000 | ORAL_TABLET | Freq: Every evening | ORAL | Status: DC | PRN
  Filled 2011-11-17: qty 1

## 2011-11-17 MED ORDER — LISINOPRIL 10 MG PO TABS
10.0000 mg | ORAL_TABLET | Freq: Every day | ORAL | Status: DC
  Administered 2011-11-17 – 2011-11-18 (×2): 10 mg via ORAL
  Filled 2011-11-17 (×3): qty 1

## 2011-11-17 MED ORDER — EZETIMIBE-SIMVASTATIN 10-40 MG PO TABS
1.0000 | ORAL_TABLET | Freq: Every day | ORAL | Status: DC
  Administered 2011-11-17 – 2011-11-18 (×2): 1 via ORAL
  Filled 2011-11-17 (×3): qty 1

## 2011-11-17 MED ORDER — SODIUM CHLORIDE 0.9 % IJ SOLN
3.0000 mL | Freq: Two times a day (BID) | INTRAMUSCULAR | Status: DC
  Administered 2011-11-18: 3 mL via INTRAVENOUS

## 2011-11-17 MED ORDER — ASPIRIN EC 325 MG PO TBEC
325.0000 mg | DELAYED_RELEASE_TABLET | Freq: Every day | ORAL | Status: DC
  Administered 2011-11-17 – 2011-11-19 (×3): 325 mg via ORAL
  Filled 2011-11-17 (×3): qty 1

## 2011-11-17 MED ORDER — SODIUM CHLORIDE 0.9 % IV SOLN: 250.0000 mL | INTRAVENOUS | Status: DC | PRN

## 2011-11-17 MED ORDER — INSULIN ASPART 100 UNIT/ML ~~LOC~~ SOLN: 0.0000 [IU] | Freq: Three times a day (TID) | SUBCUTANEOUS | Status: DC

## 2011-11-17 MED ORDER — ENOXAPARIN SODIUM 40 MG/0.4ML ~~LOC~~ SOLN
40.0000 mg | SUBCUTANEOUS | Status: DC
  Administered 2011-11-17 – 2011-11-18 (×2): 40 mg via SUBCUTANEOUS
  Filled 2011-11-17 (×3): qty 0.4

## 2011-11-17 MED ORDER — INSULIN GLARGINE 100 UNIT/ML ~~LOC~~ SOLN
20.0000 [IU] | Freq: Every day | SUBCUTANEOUS | Status: DC
  Administered 2011-11-17 – 2011-11-18 (×2): 20 [IU] via SUBCUTANEOUS

## 2011-11-17 MED ORDER — SODIUM CHLORIDE 0.9 % IJ SOLN
3.0000 mL | Freq: Two times a day (BID) | INTRAMUSCULAR | Status: DC
  Administered 2011-11-17 – 2011-11-18 (×3): 3 mL via INTRAVENOUS

## 2011-11-17 MED ORDER — ACETAMINOPHEN 650 MG RE SUPP: 650.0000 mg | Freq: Four times a day (QID) | RECTAL | Status: DC | PRN

## 2011-11-17 NOTE — H&P (Signed)
PCP:   Gaspar Garbe, MD, MD   Chief Complaint:  Seizure like activity  HPI: Patient is a 61 year old female who was seen in the office last week with complaints of occasional confusion and issues with incontinence as well as confusion.  Outpatient workup included normal electrolytes as well as an MRI and Carotids without acuity.  Has been in the ER the past 2 AM's with similar.  The ER requested a Neuro consult yesterday but they were told that she could follow up as an outpatient.  Today when she came in, the ER MD requested that the Neurologist see and admit her given seizure like activity. I was informed that they had voiced the opinion that this was all psychological before seeing the patient.  They subsequently came down and did not offer admission, but suggested outpatient EEG.  This was neither satisfying to myself or the ER MD in charge of her care, so I offered to graciously perform her Neurologic workup myself.  She is back to baseline, but per family has had episodes of not recognizing people, not knowing who she was, and being incontinent of bowel and bladder.   None of these are routine for her.  Despite some recent work stress, she has never had a psychiatric issue requiring hospitalization or any similar issues. Review of Systems  General:       Complains of headache, anorexia, malaise.        Denies fevers, chills, sleep disturbance.   Ears/Nose/Throat:       Denies earache, nasal congestion, sore throat.   Cardiovascular:       Complains of syncope.        Denies chest pains, palpitations, dyspnea on exertion, peripheral edema.   Respiratory:       Denies cough, dyspnea.   Gastrointestinal:       Complains of nausea.        Denies vomiting, diarrhea, change in bowel habits, abdominal pain, melena.   Genitourinary:       Denies dysuria, hematuria, pelvic pain.   Musculoskeletal:       Denies back pain.   Neurologic:       Complains of syncope, vertigo, dizziness.        Denies radicular symptoms, weakness, paresthesias.   Past History Past Medical History DM2 3/04 Depression HTN Lipids Seasonal Allergic Rhinitis Kidney Stones, seen by [facility] Surgical History  T & A 1954 Appy 1965 Breast cyst 1980 C 5-6 Laminectomy 21-Dec-1995 D & C, Hyst 12-21-03 Family History : Father died of lung cancer Mother Alive, SDAT, MI @ 14 1 Sister died from Ovarian cancer 3 Daughters Social History  Patient is married w/3 children.  She is a Museum/gallery exhibitions officer.   Smoker 10 pk year.  Medications: Prior to Admission medications   Medication Sig Start Date End Date Taking? Authorizing Provider  aspirin EC 325 MG tablet Take 1 tablet (325 mg total) by mouth daily. 11/16/11 11/26/11 Yes Nathan R. Pickering, MD  ezetimibe-simvastatin (VYTORIN) 10-40 MG per tablet Take 1 tablet by mouth at bedtime.   Yes Historical Provider, MD  insulin glargine (LANTUS) 100 UNIT/ML injection Inject 20 Units into the skin at bedtime.   Yes Historical Provider, MD  insulin lispro (HUMALOG) 100 UNIT/ML injection Inject 6-22 Units into the skin 3 (three) times daily before meals. Sliding scale   Yes Historical Provider, MD  lisinopril (PRINIVIL,ZESTRIL) 10 MG tablet Take 10 mg by mouth at bedtime.   Yes Historical Provider, MD  pioglitazone (ACTOS) 30 MG tablet Take 30 mg by mouth daily.    Yes Historical Provider, MD    Allergies:   Allergies  Allergen Reactions  . Codeine Shortness Of Breath  . Contrast Media (Iodinated Diagnostic Agents) Anaphylaxis     Physical Exam: Filed Vitals:   11/17/11 1239 11/17/11 1455 11/17/11 1500 11/17/11 1626  BP: 125/55 108/92 110/45 107/56  Pulse: 92 78 78 75  Temp: 98.3 F (36.8 C)     TempSrc: Oral     Resp: 18 16  20   SpO2: 98% 97% 96% 98%   General appearance: alert, cooperative and appears stated age Head: Normocephalic, without obvious abnormality, atraumatic Eyes: conjunctivae/corneas clear. PERRL, EOM's intact.  Nose: Nares normal.  Septum midline. Mucosa normal. No drainage or sinus tenderness. Throat: lips, mucosa, and tongue normal; teeth and gums normal Neck: no adenopathy, no carotid bruit, no JVD and thyroid not enlarged, symmetric, no tenderness/mass/nodules Resp: clear to auscultation bilaterally Cardio: regular rate and rhythm, S1, S2 normal, no murmur, click, rub or gallop GI: soft, non-tender; bowel sounds normal; no masses,  no organomegaly Extremities: extremities normal, atraumatic, no cyanosis or edema Pulses: 2+ and symmetric Lymph nodes: Cervical adenopathy: no cervical lymphadenopathy Neurologic: Alert and oriented X 3, normal strength and tone. Normal symmetric reflexes.     Labs on Admission:   [facility] 11/17/11 1447 11/17/11 0928 11/16/11 0750  NA -- 142 140  K -- 3.9 3.8  CL -- 106 105  CO2 -- 28 24  GLUCOSE -- 63* 120*  BUN -- 12 13  CREATININE 0.61 0.62 --  CALCIUM -- 9.7 9.5  MG -- -- --  PHOS -- -- --    Basename 11/17/11 0928 11/16/11 0750  AST 22 24  ALT 16 18  ALKPHOS 82 92  BILITOT 0.3 0.3  PROT 6.6 6.8  ALBUMIN 3.6 3.7    Basename 11/17/11 1447 11/17/11 0928 11/16/11 0750  WBC 7.2 7.4 --  NEUTROABS -- 5.9 5.2  HGB 13.1 13.4 --  HCT 38.3 38.8 --  MCV 91.0 91.1 --  PLT 217 237 --    Basename 11/16/11 0742  CKTOTAL 162  CKMB 3.1  CKMBINDEX --  TROPONINI <0.30   Lab Results  Component Value Date   INR 0.91 11/16/2011   No results found for this basename: TSH,T4TOTAL,FREET3,T3FREE,THYROIDAB in the last 72 hours No results found for this basename: VITAMINB12:2,FOLATE:2,FERRITIN:2,TIBC:2,IRON:2,RETICCTPCT:2 in the last 72 hours  Radiological Exams on Admission: Dg Chest 2 View  11/16/2011  *RADIOLOGY REPORT*  Clinical Data: Confusion  CHEST - 2 VIEW  Comparison: None.  Findings: Normal heart size.  Clear lungs.  Increased AP diameter of the chest.  Bronchitic changes.  No pneumothorax and no pleural effusion.  IMPRESSION: Findings related to COPD are noted but  there is no active cardiopulmonary disease.  Original Report Authenticated By: Donavan Burnet, M.D.  Assessment/Plan Seizure like activity.  I strongly believe that a patient who has been in the ER 3 times in the past 5 days with seizure like episodes deserves to be evaluated as an inpatient, regardless of what the consulting Neurologist felt on their eval and I agree with the ER MD's stance on this.  I will order an EEG and observe her as well.  If she is found to have a positive issue, will strongly consider referral to a tertiary care center such as [facility]. MRI and Carotids were completed as outpatient.  Seizure precautions, Neuro floor. DM2- SSI, insulin.  Her episodes  are not coupled with lows as documented by family HTN: Controlled Hyperlipidemia: Continue meds  Aldridge Krzyzanowski W 11/17/2011, 4:52 PM

## 2011-11-17 NOTE — Consult Note (Signed)
[facility] CONSULT [facility]     Reason for Consult: Altered mental status    HPI:    Yvette Graham is an 61 y.o. female who woke up on floor Friday am, incontinent of stool. Uncertain time of LOC. She saw dogs licking her face, but says that she did not recognize them as hers. She crawled to the hall and called for her husband. Stuttered speech intermittently. Episode lasted about 30 minutes. She was taken to see Dr. Wylene Simmer who sent her for MRI which the family states was normal. The daughter has the disk and will bring it in from [facility].   Yesterday, similar episode, woke her husband up stating she did not feel normal. Recognized him but still had stuttered speech, but was able to answer questions and follow commands. This lasted 30 minutes.   Today, family describes similar story. Stuttering speech, bowl incontinence, family does not recall any tonic-clonic activity or automatism. First noted at 7:30am, symptoms intermittently up until now, where she back to normal according to family.  Family does mention that she has been under significant stress due to job issues. Family has been concerned about her stress level. Also, she is not sleeping well.   Past Medical History  Diagnosis Date  . Diabetes mellitus   . Hypertension   . Hypercholesteremia     Past Surgical History  Procedure Date  . Neck surgery   . Appendectomy     Family history of stroke, mother.  Social History:  reports that she has been smoking Cigarettes.  She has been smoking about .5 packs per day. She does not have any smokeless tobacco history on file. She reports that she does not drink alcohol or use illicit drugs.  Allergies  Allergen Reactions  . Codeine Shortness Of Breath  . Contrast Media (Iodinated Diagnostic Agents) Anaphylaxis    Medications:    Scheduled: NONE.  Review of Systems - General ROS: negative for - chills, fatigue, fever or hot  flashes Hematological and Lymphatic ROS: negative for - bruising, fatigue, jaundice or pallor Endocrine ROS: negative for - hair pattern changes, hot flashes, mood swings or skin changes Respiratory ROS: negative for - cough, hemoptysis, orthopnea or wheezing Cardiovascular ROS: negative for - dyspnea on exertion, orthopnea, palpitations or shortness of breath Gastrointestinal ROS: negative for - abdominal pain, appetite loss, blood in stools, diarrhea or hematemesis, positive for stool incontinence. Musculoskeletal ROS: negative for - joint pain, joint stiffness, joint swelling or muscle pain Neurological ROS: positive for - speech problems and visual changes Dermatological ROS: negative for dry skin, pruritus and rash   Blood pressure 128/56, pulse 74, temperature 98.2 F (36.8 C), temperature source Oral, resp. rate 15, SpO2 98.00%.   Neurologic Examination:   Mental Status: Alert, oriented, thought content appropriate.  Speech fluent without evidence of aphasia. Able to follow 3 step commands without difficulty. Cranial Nerves: II-Visual fields grossly intact. III/IV/VI-Extraocular movements intact.  Pupils reactive bilaterally. V/VII-Smile symmetric VIII-grossly intact IX/X-normal gag XI-bilateral shoulder shrug XII-midline tongue extension Motor: 5/5 bilaterally with normal tone and bulk Sensory: Pinprick and light touch intact throughout, bilaterally Deep Tendon Reflexes: 2+ and symmetric throughout Plantars: Downgoing bilaterally Cerebellar: Normal finger-to-nose..   No results found for this basename: cbc, bmp, coags, chol, tri, ldl, hga1c    Results for orders placed during the hospital encounter of 11/17/11 (from the  past 48 hour(s))  URINE RAPID DRUG SCREEN (HOSP PERFORMED)     Status: Normal   Collection Time   11/17/11  9:04 AM      Component Value Range Comment   Opiates NONE DETECTED  NONE DETECTED     Cocaine NONE DETECTED  NONE DETECTED     Benzodiazepines  NONE DETECTED  NONE DETECTED     Amphetamines NONE DETECTED  NONE DETECTED     Tetrahydrocannabinol NONE DETECTED  NONE DETECTED     Barbiturates NONE DETECTED  NONE DETECTED    GLUCOSE, CAPILLARY     Status: Abnormal   Collection Time   11/17/11  9:07 AM      Component Value Range Comment   Glucose-Capillary 52 (*) 70 - 99 (mg/dL)    Comment 1 Documented in Chart      Comment 2 Notify RN     CBC     Status: Normal   Collection Time   11/17/11  9:28 AM      Component Value Range Comment   WBC 7.4  4.0 - 10.5 (K/uL)    RBC 4.26  3.87 - 5.11 (MIL/uL)    Hemoglobin 13.4  12.0 - 15.0 (g/dL)    HCT 32.2  02.5 - 42.7 (%)    MCV 91.1  78.0 - 100.0 (fL)    MCH 31.5  26.0 - 34.0 (pg)    MCHC 34.5  30.0 - 36.0 (g/dL)    RDW 06.2  37.6 - 28.3 (%)    Platelets 237  150 - 400 (K/uL)   DIFFERENTIAL     Status: Abnormal   Collection Time   11/17/11  9:28 AM      Component Value Range Comment   Neutrophils Relative 80 (*) 43 - 77 (%)    Neutro Abs 5.9  1.7 - 7.7 (K/uL)    Lymphocytes Relative 14  12 - 46 (%)    Lymphs Abs 1.0  0.7 - 4.0 (K/uL)    Monocytes Relative 6  3 - 12 (%)    Monocytes Absolute 0.4  0.1 - 1.0 (K/uL)    Eosinophils Relative 1  0 - 5 (%)    Eosinophils Absolute 0.1  0.0 - 0.7 (K/uL)    Basophils Relative 0  0 - 1 (%)    Basophils Absolute 0.0  0.0 - 0.1 (K/uL)   COMPREHENSIVE METABOLIC PANEL     Status: Abnormal   Collection Time   11/17/11  9:28 AM      Component Value Range Comment   Sodium 142  135 - 145 (mEq/L)    Potassium 3.9  3.5 - 5.1 (mEq/L)    Chloride 106  96 - 112 (mEq/L)    CO2 28  19 - 32 (mEq/L)    Glucose, Bld 63 (*) 70 - 99 (mg/dL)    BUN 12  6 - 23 (mg/dL)    Creatinine, Ser 1.51  0.50 - 1.10 (mg/dL)    Calcium 9.7  8.4 - 10.5 (mg/dL)    Total Protein 6.6  6.0 - 8.3 (g/dL)    Albumin 3.6  3.5 - 5.2 (g/dL)    AST 22  0 - 37 (U/L)    ALT 16  0 - 35 (U/L)    Alkaline Phosphatase 82  39 - 117 (U/L)    Total Bilirubin 0.3  0.3 - 1.2 (mg/dL)    GFR  calc non Af Amer >90  >90 (mL/min)    GFR calc Af Amer >  90  >90 (mL/min)   GLUCOSE, CAPILLARY     Status: Normal   Collection Time   11/17/11 10:11 AM      Component Value Range Comment   Glucose-Capillary 77  70 - 99 (mg/dL)    Comment 1 Documented in Chart      Comment 2 Notify RN       Dg Chest 2 View  11/16/2011  *RADIOLOGY REPORT*  Clinical Data: Confusion  CHEST - 2 VIEW  Comparison: None.  Findings: Normal heart size.  Clear lungs.  Increased AP diameter of the chest.  Bronchitic changes.  No pneumothorax and no pleural effusion.  IMPRESSION: Findings related to COPD are noted but there is no active cardiopulmonary disease.  Original Report Authenticated By: Donavan Burnet, M.D.     Assessment/Plan:   61 yo female with multiple admissions to ED for AMS and fecal incontinence. Neurology asked to see for evaluation for possible SZ and evaluation of AMS.  Recent MRI/Carotid dopplers normal per family (who will bring in CD disc from local facility). Neurological exam is non-focal. Patient is currently at baseline per family.   Recommend: 1) Patient hypoglycemic in ER. Would recommend further diabetic evaluation as outpatient as this could be related to AMS. She takes insulin,     Lantus, Actos at bedtime. 2) Consider EEG as outpatient, if continued symptoms with normal EEG, may need EMU in the outpatient setting. 3) Due to fecal incontinence, non-neurological in nature, would recommend GI evaluation. 4) Recent stress/insomnia, consider trazadone for temporary assistance.   Patient seen and evaluated by Dr. Elita Boone.  Gwendolyn Lima. Manson Passey, [facility] [phone]  11/17/2011, 11:08 AM

## 2011-11-17 NOTE — ED Provider Notes (Signed)
History     CSN: [id number]  Arrival date & time 11/17/11  0825   None     Chief Complaint  Patient presents with  . Altered Mental Status    (Consider location/radiation/quality/duration/timing/severity/associated sxs/prior treatment) Patient is a 61 y.o. female presenting with neurologic complaint. The history is provided by the patient.  Neurologic Problem The primary symptoms include altered mental status. Primary symptoms do not include headaches, dizziness, fever, nausea or vomiting. The symptoms began less than 1 hour ago. The symptoms are improving. The neurological symptoms are focal. Context: while laying in bed.  The change in mental status began today. The altered mental status developed suddenly. The change in mental status has been gradually improving since its onset. The change in mental status includes a disturbance of speech.  Additional symptoms do not include neck stiffness. Workup history includes MRI.    Past Medical History  Diagnosis Date  . Diabetes mellitus   . Hypertension   . Hypercholesteremia     Past Surgical History  Procedure Date  . Neck surgery   . Appendectomy     No family history on file.  History  Substance Use Topics  . Smoking status: Current Everyday Smoker -- 0.5 packs/day    Types: Cigarettes  . Smokeless tobacco: Not on file  . Alcohol Use: No    OB History    Grav Para Term Preterm Abortions TAB SAB Ect Mult Living                  Review of Systems  Constitutional: Negative for fever and fatigue.  HENT: Negative for congestion, drooling, neck pain and neck stiffness.   Eyes: Negative for pain.  Respiratory: Negative for cough and shortness of breath.   Cardiovascular: Negative for chest pain.  Gastrointestinal: Negative for nausea, vomiting, abdominal pain and diarrhea.  Genitourinary: Negative for dysuria and hematuria.       Bowel incontinence   Musculoskeletal: Negative for back pain and gait problem.  Skin:  Negative for color change.  Neurological: Negative for dizziness and headaches.  Hematological: Negative for adenopathy.  Psychiatric/Behavioral: Positive for altered mental status. Negative for behavioral problems.  All other systems reviewed and are negative.    Allergies  Codeine and Contrast media  Home Medications   Current Outpatient Rx  Name Route Sig Dispense Refill  . ASPIRIN EC 325 MG PO TBEC Oral Take 1 tablet (325 mg total) by mouth daily. 30 tablet 0  . EZETIMIBE-SIMVASTATIN 10-40 MG PO TABS Oral Take 1 tablet by mouth at bedtime.    . INSULIN GLARGINE 100 UNIT/ML [place] SOLN Subcutaneous Inject 20 Units into the skin at bedtime.    . INSULIN LISPRO (HUMAN) 100 UNIT/ML [place] SOLN Subcutaneous Inject 6-22 Units into the skin 3 (three) times daily before meals. Sliding scale    . LISINOPRIL 10 MG PO TABS Oral Take 10 mg by mouth at bedtime.    Marland Kitchen PIOGLITAZONE HCL 30 MG PO TABS Oral Take 30 mg by mouth daily.       BP 146/71  Pulse 74  Temp(Src) 98.4 F (36.9 C) (Oral)  Resp 15  SpO2 100%  Physical Exam  Constitutional: She is oriented to person, place, and time. She appears well-developed and well-nourished.  HENT:  Head: Normocephalic.  Mouth/Throat: No oropharyngeal exudate.  Eyes: Conjunctivae and EOM are normal. Pupils are equal, round, and reactive to light.  Neck: Normal range of motion. Neck supple.  Cardiovascular: Normal rate, regular rhythm, normal  heart sounds and intact distal pulses.  Exam reveals no gallop and no friction rub.   No murmur heard. Pulmonary/Chest: Effort normal and breath sounds normal. No respiratory distress. She has no wheezes.  Abdominal: Soft. Bowel sounds are normal. There is no tenderness.  Musculoskeletal: Normal range of motion. She exhibits no edema and no tenderness.  Neurological: She is alert and oriented to person, place, and time. She has normal strength. No cranial nerve deficit or sensory deficit.       Unstable when  standing. Broken speech on exam. Normal comprehension.  Skin: Skin is warm and dry.  Psychiatric: She has a normal mood and affect. Her behavior is normal.    ED Course  Procedures (including critical care time)  Labs Reviewed - No data to display Dg Chest 2 View  11/16/2011  *RADIOLOGY REPORT*  Clinical Data: Confusion  CHEST - 2 VIEW  Comparison: None.  Findings: Normal heart size.  Clear lungs.  Increased AP diameter of the chest.  Bronchitic changes.  No pneumothorax and no pleural effusion.  IMPRESSION: Findings related to COPD are noted but there is no active cardiopulmonary disease.  Original Report Authenticated By: Donavan Burnet, M.D.     No diagnosis found.    MDM  8:53 AM 61 y.o. female w hx of DM, HTN, pw 3rd episode of AMS in last 4 days. Pt seen by pcp 4 days ago and here yesterday. Has had neg MRI and carotid doppler workup thus far. Case discussed w/ neurologist yesterday, pt in process of referral to neurologist. Family notes upon awakening this am, pt was confused and non-sensical. Also had incont of bowels. Pt AFVSS here, broken speech on exam, but normal comprehension. No slurring of speech on exam, pt states she has difficulty saying the right words. No gross motor/sensory deficits noted. Pt denies cp/sob/dizziness/HA.  9:26 AM Discussed case w/ Dr. Elita Boone who will see pt in ED. Notified pcp, Dr. Wylene Simmer, who would like pt admitted.   4:07 PM Pt will be admitted by Dr. Wylene Simmer.  Clinical Impression 1. Altered mental status           Purvis Sheffield, MD 11/17/11 1742

## 2011-11-17 NOTE — ED Notes (Signed)
MEAL ORDERED FOR PT 

## 2011-11-17 NOTE — ED Provider Notes (Signed)
Patient 3 episodes over the past 4 days of altered mental status i.e. has trouble speaking, was found on floor 4 days ago unable to speak approximately one hour. Patient is improved without treatment since arrival here on exam patient is alert follows some commands moves all extremities motor strength 5 over 5 overall speech is slow, somewhat stuttering. Cranial nerves II through XII grossly intact  Neurology consult in the emergency department., As I suspect patient may be having recurrent seizures Spoke with Dr. Wylene Simmer to arrange for admission  Doug Sou, MD 11/17/11 1428

## 2011-11-17 NOTE — ED Notes (Signed)
PATIENT REPORTS ON Friday SHE WAS ALONE. STATES SHE AWOKE WITH LOSS OF BOWEL CONTROL, BLURRED VISION, LEGS WEAK, HEADACHE. STATES SHE DID NOT FEEL WELL . UNSURE IF SHE HAD SPEECH CHANGES DUE TO NO ONE TO TALK TO. STATES EPISODE LASTED APPROX 30 MIN OR SO. STATES OVER THE WEEKEND ASYMPTOMATIC. STATES DID SEE HER PRIMARY Friday AND HAD MRI DONE. STATES Monday AWOKE AND WAS UNABLE TO EXPRESS HERSELF. STATES SHE DOES NOT RECALL THE EVENTS CLEARLY BUT KNOWS HER HUSBAND CHECKED HER SUGAR AND WAS WNL. STATES LASTED LONGER THAN Friday. HUSBAND CALLED EMS AND WAS EVAL HERE. THIS MORNING AWOKE WITH BOWEL INCONTINENCE, CONFUSION, DIZZINESS, EXPRESSIVE APHASIA, AND IT LASTED FOR 1-2 HOURS. CONTINUES WITH HEADACHE AT BASE OF SKULL TO BACK OF HER HEAD. STATES HER HEAD FEELS HEAVY,. DENIES DIZZINESS. SPEECH CLEAR AND ARTICULATE. GAIT STEADY.SINUS RHYTHM ON MONITOR.

## 2011-11-17 NOTE — ED Notes (Signed)
PER EMS: Patient from home, daughter reporting patient has been acting strange since last Friday, had MRI on Friday with negative results, patient was seen yesterday in ED had carotid dopplers. Patient still not acting her normal self.

## 2011-11-17 NOTE — Consult Note (Signed)
I obtained hx from pt and son and examined pt.  I concur with the above. Velicia Dejager A. Elita Boone, MD

## 2011-11-18 ENCOUNTER — Observation Stay (HOSPITAL_COMMUNITY): Payer: BC Managed Care – PPO

## 2011-11-18 DIAGNOSIS — R569 Unspecified convulsions: Secondary | ICD-10-CM

## 2011-11-18 LAB — CORTISOL-AM, BLOOD: Cortisol - AM: 21.7 ug/dL (ref 4.3–22.4)

## 2011-11-18 LAB — GLUCOSE, CAPILLARY
Glucose-Capillary: 278 mg/dL — ABNORMAL HIGH (ref 70–99)
Glucose-Capillary: 158 mg/dL — ABNORMAL HIGH (ref 70–99)

## 2011-11-18 LAB — COMPREHENSIVE METABOLIC PANEL
ALT: 14 U/L (ref 0–35)
AST: 20 U/L (ref 0–37)
CO2: 26 mEq/L (ref 19–32)
Chloride: 102 mEq/L (ref 96–112)
Creatinine, Ser: 0.62 mg/dL (ref 0.50–1.10)
GFR calc non Af Amer: >90 mL/min (ref >90)
Glucose, Bld: 288 mg/dL — ABNORMAL HIGH (ref 70–99)
Total Bilirubin: 0.4 mg/dL (ref 0.3–1.2)

## 2011-11-18 LAB — CBC
Hemoglobin: 13.6 g/dL (ref 12.0–15.0)
MCV: 92.3 fL (ref 78.0–100.0)
Platelets: 197 10*3/uL (ref 150–400)
RBC: 4.39 MIL/uL (ref 3.87–5.11)
WBC: 4.6 10*3/uL (ref 4.0–10.5)

## 2011-11-18 MED ORDER — DEXTROSE 50 % IV SOLN
INTRAVENOUS | Status: AC
  Administered 2011-11-18: 50 mL
  Filled 2011-11-18: qty 50

## 2011-11-18 NOTE — ED Provider Notes (Signed)
I have personally seen and examined the patient.  I have discussed the plan of care with the resident.  I have reviewed the documentation on PMH/FH/Soc. History.  I have reviewed the documentation of the resident and agree.  Doug Sou, MD 11/18/11 1056

## 2011-11-18 NOTE — Progress Notes (Signed)
Utilization review complete 

## 2011-11-18 NOTE — Progress Notes (Signed)
Subjective: Patient doing well today. No seizures or other episodes overnight.   Objective: Current vital signs: BP 131/64  Pulse 71  Temp(Src) 98.2 F (36.8 C) (Oral)  Resp 18  Ht 5\' 2"  (1.575 m)  Wt 77.7 kg (171 lb 4.8 oz)  BMI 31.33 kg/m2  SpO2 98% Vital signs in last 24 hours: Temp:  [98 F (36.7 C)-98.5 F (36.9 C)] 98.2 F (36.8 C) (05/15 0600) Pulse Rate:  [65-100] 71  (05/15 0600) Resp:  [16-20] 18  (05/15 0600) BP: (102-131)/(45-92) 131/64 mmHg (05/15 0600) SpO2:  [95 %-98 %] 98 % (05/15 0600) Weight:  [77.7 kg (171 lb 4.8 oz)] 77.7 kg (171 lb 4.8 oz) (05/14 2300)  Intake/Output from previous day: 05/14 0701 - 05/15 0700 In: 3 [I.V.:3] Out: -  Intake/Output this shift: Total I/O In: 360 [P.O.:360] Out: -  Nutritional status: Carb Control  Neurologic Exam: Ment: Alert and fully oriented. No dysarthria or dysphasia.  CN: EOMI, PERRL, no nystagmus, VII symmetric, hearing intact to conversation, phonation intact, shoulder shrug intact.  Motor: 5/5 in all 4 extremities.  Sensory: Intact to fine touch and temperature.  Reflexes: Normoactive x 4.  Cerebellar: No dymetria or dyssinergia on FNF.   Lab Results: Results for orders placed during the hospital encounter of 11/17/11 (from the past 48 hour(s))  URINE RAPID DRUG SCREEN (HOSP PERFORMED)     Status: Normal   Collection Time   11/17/11  9:04 AM      Component Value Range Comment   Opiates NONE DETECTED  NONE DETECTED     Cocaine NONE DETECTED  NONE DETECTED     Benzodiazepines NONE DETECTED  NONE DETECTED     Amphetamines NONE DETECTED  NONE DETECTED     Tetrahydrocannabinol NONE DETECTED  NONE DETECTED     Barbiturates NONE DETECTED  NONE DETECTED    GLUCOSE, CAPILLARY     Status: Abnormal   Collection Time   11/17/11  9:07 AM      Component Value Range Comment   Glucose-Capillary 52 (*) 70 - 99 (mg/dL)    Comment 1 Documented in Chart      Comment 2 Notify RN     CBC     Status: Normal   Collection  Time   11/17/11  9:28 AM      Component Value Range Comment   WBC 7.4  4.0 - 10.5 (K/uL)    RBC 4.26  3.87 - 5.11 (MIL/uL)    Hemoglobin 13.4  12.0 - 15.0 (g/dL)    HCT 16.1  09.6 - 04.5 (%)    MCV 91.1  78.0 - 100.0 (fL)    MCH 31.5  26.0 - 34.0 (pg)    MCHC 34.5  30.0 - 36.0 (g/dL)    RDW 40.9  81.1 - 91.4 (%)    Platelets 237  150 - 400 (K/uL)   DIFFERENTIAL     Status: Abnormal   Collection Time   11/17/11  9:28 AM      Component Value Range Comment   Neutrophils Relative 80 (*) 43 - 77 (%)    Neutro Abs 5.9  1.7 - 7.7 (K/uL)    Lymphocytes Relative 14  12 - 46 (%)    Lymphs Abs 1.0  0.7 - 4.0 (K/uL)    Monocytes Relative 6  3 - 12 (%)    Monocytes Absolute 0.4  0.1 - 1.0 (K/uL)    Eosinophils Relative 1  0 - 5 (%)  Eosinophils Absolute 0.1  0.0 - 0.7 (K/uL)    Basophils Relative 0  0 - 1 (%)    Basophils Absolute 0.0  0.0 - 0.1 (K/uL)   COMPREHENSIVE METABOLIC PANEL     Status: Abnormal   Collection Time   11/17/11  9:28 AM      Component Value Range Comment   Sodium 142  135 - 145 (mEq/L)    Potassium 3.9  3.5 - 5.1 (mEq/L)    Chloride 106  96 - 112 (mEq/L)    CO2 28  19 - 32 (mEq/L)    Glucose, Bld 63 (*) 70 - 99 (mg/dL)    BUN 12  6 - 23 (mg/dL)    Creatinine, Ser 1.61  0.50 - 1.10 (mg/dL)    Calcium 9.7  8.4 - 10.5 (mg/dL)    Total Protein 6.6  6.0 - 8.3 (g/dL)    Albumin 3.6  3.5 - 5.2 (g/dL)    AST 22  0 - 37 (U/L)    ALT 16  0 - 35 (U/L)    Alkaline Phosphatase 82  39 - 117 (U/L)    Total Bilirubin 0.3  0.3 - 1.2 (mg/dL)    GFR calc non Af Amer >90  >90 (mL/min)    GFR calc Af Amer >90  >90 (mL/min)   GLUCOSE, CAPILLARY     Status: Normal   Collection Time   11/17/11 10:11 AM      Component Value Range Comment   Glucose-Capillary 77  70 - 99 (mg/dL)    Comment 1 Documented in Chart      Comment 2 Notify RN     CBC     Status: Normal   Collection Time   11/17/11  2:47 PM      Component Value Range Comment   WBC 7.2  4.0 - 10.5 (K/uL)    RBC 4.21  3.87  - 5.11 (MIL/uL)    Hemoglobin 13.1  12.0 - 15.0 (g/dL)    HCT 09.6  04.5 - 40.9 (%)    MCV 91.0  78.0 - 100.0 (fL)    MCH 31.1  26.0 - 34.0 (pg)    MCHC 34.2  30.0 - 36.0 (g/dL)    RDW 81.1  91.4 - 78.2 (%)    Platelets 217  150 - 400 (K/uL)   CREATININE, SERUM     Status: Normal   Collection Time   11/17/11  2:47 PM      Component Value Range Comment   Creatinine, Ser 0.61  0.50 - 1.10 (mg/dL)    GFR calc non Af Amer >90  >90 (mL/min)    GFR calc Af Amer >90  >90 (mL/min)   GLUCOSE, CAPILLARY     Status: Abnormal   Collection Time   11/17/11  5:50 PM      Component Value Range Comment   Glucose-Capillary 271 (*) 70 - 99 (mg/dL)    Comment 1 Documented in Chart      Comment 2 Notify RN     GLUCOSE, CAPILLARY     Status: Abnormal   Collection Time   11/17/11 10:10 PM      Component Value Range Comment   Glucose-Capillary 291 (*) 70 - 99 (mg/dL)    Comment 1 Notify RN     CBC     Status: Normal   Collection Time   11/18/11  5:50 AM      Component Value Range Comment   WBC 4.6  4.0 -  10.5 (K/uL)    RBC 4.39  3.87 - 5.11 (MIL/uL)    Hemoglobin 13.6  12.0 - 15.0 (g/dL)    HCT 51.8  84.1 - 66.0 (%)    MCV 92.3  78.0 - 100.0 (fL)    MCH 31.0  26.0 - 34.0 (pg)    MCHC 33.6  30.0 - 36.0 (g/dL)    RDW 63.0  16.0 - 10.9 (%)    Platelets 197  150 - 400 (K/uL)   COMPREHENSIVE METABOLIC PANEL     Status: Abnormal   Collection Time   11/18/11  5:50 AM      Component Value Range Comment   Sodium 137  135 - 145 (mEq/L)    Potassium 4.0  3.5 - 5.1 (mEq/L)    Chloride 102  96 - 112 (mEq/L)    CO2 26  19 - 32 (mEq/L)    Glucose, Bld 288 (*) 70 - 99 (mg/dL)    BUN 15  6 - 23 (mg/dL)    Creatinine, Ser 3.23  0.50 - 1.10 (mg/dL)    Calcium 9.5  8.4 - 10.5 (mg/dL)    Total Protein 6.5  6.0 - 8.3 (g/dL)    Albumin 3.4 (*) 3.5 - 5.2 (g/dL)    AST 20  0 - 37 (U/L)    ALT 14  0 - 35 (U/L)    Alkaline Phosphatase 81  39 - 117 (U/L)    Total Bilirubin 0.4  0.3 - 1.2 (mg/dL)    GFR calc non Af  Amer >90  >90 (mL/min)    GFR calc Af Amer >90  >90 (mL/min)   GLUCOSE, CAPILLARY     Status: Abnormal   Collection Time   11/18/11  6:59 AM      Component Value Range Comment   Glucose-Capillary 278 (*) 70 - 99 (mg/dL)    Comment 1 Notify RN       No results found for this or any previous visit (from the past 240 hour(s)).  Lipid Panel No results found for this basename: CHOL,TRIG,HDL,CHOLHDL,VLDL,LDLCALC in the last 72 hours  Studies/Results: No results found.  Medications:  Scheduled:   . aspirin EC  325 mg Oral Daily  . enoxaparin  40 mg Subcutaneous Q24H  . ezetimibe-simvastatin  1 tablet Oral QHS  . insulin aspart  0-15 Units Subcutaneous TID WC  . insulin glargine  20 Units Subcutaneous QHS  . lisinopril  10 mg Oral QHS  . pioglitazone  30 mg Oral Daily  . sodium chloride  3 mL Intravenous Q12H  . sodium chloride  3 mL Intravenous Q12H  . DISCONTD: insulin aspart  0-15 Units Subcutaneous TID WC    Assessment/Plan:  1. Episodic AMS with fecal incontinence and post-event confusion. Neurological exam is nonfocal. The differential diagnosis for the patient's symptoms includes seizures, epileptic versus nonepileptic (stress-related) as the most likely components. Other DDx includes vertebrobasilar insufficiency with TIA affecting the brainstem, hypoglycemia triggering seizure or AMS due to hypoglycemia.  2. Agree with obtaining routine EEG. If negative, obtain 24 hour EEG monitoring.  3. If EEG testing negative, consider further work up for possible vertebrobasilar insufficiency, to include CTA or MRA of head and neck. Holter monitoring may also yield further information regarding possible cardiac arrhythmia as a potential cause for her spells.    LOS: 1 day   Electronically signed by Dr. Caryl Pina Minerva Areola Lalla Laham/ 11/18/2011  12:00 PM

## 2011-11-18 NOTE — Progress Notes (Signed)
Subjective: No activity overnight.  She has multiple theories on cause, including meds, bp, etc.  Anxious to go home and see if she can make it happen again.  Told her that she needs to get her EEG and be watched another 24 hours.  Her husband is clearly behind this plan.  Objective: Vital signs in last 24 hours: Temp:  [98 F (36.7 C)-98.5 F (36.9 C)] 98.2 F (36.8 C) (05/15 0600) Pulse Rate:  [65-100] 71  (05/15 0600) Resp:  [15-20] 18  (05/15 0600) BP: (102-146)/(45-92) 131/64 mmHg (05/15 0600) SpO2:  [95 %-100 %] 98 % (05/15 0600) Weight:  [77.7 kg (171 lb 4.8 oz)] 77.7 kg (171 lb 4.8 oz) (05/14 2300) Weight change:  Last BM Date: 11/17/11  Intake/Output from previous day: 05/14 0701 - 05/15 0700 In: 3 [I.V.:3] Out: -  Intake/Output this shift:    General appearance: alert and appears stated age Neck: no adenopathy, no carotid bruit, no JVD, supple, symmetrical, trachea midline and thyroid not enlarged, symmetric, no tenderness/mass/nodules Resp: clear to auscultation bilaterally Cardio: regular rate and rhythm, S1, S2 normal, no murmur, click, rub or gallop GI: soft, non-tender; bowel sounds normal; no masses,  no organomegaly Extremities: extremities normal, atraumatic, no cyanosis or edema Neurologic: Alert and oriented X 3, normal strength and tone. Normal symmetric reflexes. Normal coordination and gait  Lab Results:  Basename 11/18/11 0550 11/17/11 1447  WBC 4.6 7.2  HGB 13.6 13.1  HCT 40.5 38.3  PLT 197 217   BMET  Basename 11/18/11 0550 11/17/11 1447 11/17/11 0928  NA 137 -- 142  K 4.0 -- 3.9  CL 102 -- 106  CO2 26 -- 28  GLUCOSE 288* -- 63*  BUN 15 -- 12  CREATININE 0.62 0.61 --  CALCIUM 9.5 -- 9.7    Studies/Results: Dg Chest 2 View  11/16/2011  *RADIOLOGY REPORT*  Clinical Data: Confusion  CHEST - 2 VIEW  Comparison: None.  Findings: Normal heart size.  Clear lungs.  Increased AP diameter of the chest.  Bronchitic changes.  No pneumothorax and no  pleural effusion.  IMPRESSION: Findings related to COPD are noted but there is no active cardiopulmonary disease.  Original Report Authenticated By: Donavan Burnet, M.D.    Medications:  I have reviewed the patient's current medications. Scheduled:   . aspirin EC  325 mg Oral Daily  . enoxaparin  40 mg Subcutaneous Q24H  . ezetimibe-simvastatin  1 tablet Oral QHS  . insulin aspart  0-15 Units Subcutaneous TID WC  . insulin glargine  20 Units Subcutaneous QHS  . lisinopril  10 mg Oral QHS  . pioglitazone  30 mg Oral Daily  . sodium chloride  3 mL Intravenous Q12H  . sodium chloride  3 mL Intravenous Q12H  . DISCONTD: insulin aspart  0-15 Units Subcutaneous TID WC   Continuous:  RUE:AVWUJW chloride, acetaminophen, acetaminophen, senna-docusate, sodium chloride  Assessment/Plan: Seizure like activity.  No events overnight.  Re-explained the precautions and she cannot deviate from them, since we need to catch this on telemetry and by video if/when it happens.  EEG planned for today. DM2-  SSI + her basal meds.  She is off track from yesterday, but this clearly bothers her.  Will take a day to settle down. HTN: BP was mildly low last night, but not enough to cause sxs.  Normal this AM Hyperlipidemia:  Continue meds. If EEG negative and no further episodes, will be discharged tomorrow AM with suggestion that if it  happens again, she go to [facility] for tertiary care as Neuro was not interested in providing inpatient care here and despite 3 trips to the ER, appropriate imaging and labs, we have no cause noted.  LOS: 1 day   Yarianna Varble W 11/18/2011, 7:35 AM

## 2011-11-18 NOTE — Procedures (Unsigned)
CAROTID DUPLEX EXAM  INDICATION:  Syncope.  HISTORY: Diabetes:  Yes Cardiac:  No Hypertension:  Yes Smoking:  Currently Previous Surgery:  No carotid intervention CV History:  Intermittent episodes of vertigo, recent TIA Amaurosis Fugax No, Paresthesias No, Hemiparesis No                                      RIGHT             LEFT Brachial systolic pressure:         124               120 Brachial Doppler waveforms:         WNL               WNL Vertebral direction of flow:        Antegrade         Antegrade DUPLEX VELOCITIES (cm/sec) CCA peak systolic                   100               104 ECA peak systolic                   132               155 ICA peak systolic                   95                104 ICA end diastolic                   29                36 PLAQUE MORPHOLOGY:                  Heterogeneous     Heterogeneous PLAQUE AMOUNT:                      Mild              Mild PLAQUE LOCATION:                    CCA/ICA           CCA/ICA/ECA  IMPRESSION: 1. Bilateral internal carotid artery stenosis present in the 1%-39%     range. 2. Right external carotid artery is patent. 3. Left external carotid artery stenosis present. 4. Bilateral vertebral arteries are patent and antegrade.  ___________________________________________ Di Kindle. Edilia Bo, M.D.  SH/MEDQ  D:  11/16/2011  T:  11/16/2011  Job:  [id number]

## 2011-11-19 DIAGNOSIS — R4182 Altered mental status, unspecified: Secondary | ICD-10-CM

## 2011-11-19 LAB — GLUCOSE, CAPILLARY
Glucose-Capillary: 17 mg/dL — CL (ref 70–99)
Glucose-Capillary: 184 mg/dL — ABNORMAL HIGH (ref 70–99)
Glucose-Capillary: 422 mg/dL — ABNORMAL HIGH (ref 70–99)
Glucose-Capillary: 45 mg/dL — ABNORMAL LOW (ref 70–99)

## 2011-11-19 MED ORDER — INSULIN ASPART 100 UNIT/ML ~~LOC~~ SOLN: 0.0000 [IU] | Freq: Three times a day (TID) | SUBCUTANEOUS | Status: DC

## 2011-11-19 NOTE — Procedures (Signed)
EEG NUMBER:  [id number].  This routine EEG was requested in this 61 year old female admitted with episodes of confusion and possible seizure-like activity.  She is also having issues of incontinence with confusion.  The episodes are usually after she is awakened.  She is on no anticonvulsant medications.  EEG was done with the patient awake and drowsy.  During periods of maximal wakefulness, she had a 10 cycle per second medium amplitude, posterior dominant rhythm that attenuated with eye opening and was symmetric.  Background activities were composed of low amplitude beta activities that were frontally dominant and symmetric.  Photic stimulation produced a symmetric driving response. Hyperventilation for 3 minutes with good effort did not markedly change the tracing.  The patient became drowsy as characterized by attenuation of muscle activity as well as of the alpha rhythm.  There is the emergence of  diffuse theta activities during this time.  Also seen were slower symmetric theta activities.  Stage II sleep was not reached.  CLINICAL INTERPRETATION:  This routine EEG done with the patient awake and drowsy is normal.          ______________________________ Denton Meek, MD    ON:GEXB D:  11/19/2011 09:35:11  T:  11/19/2011 09:50:52  Job #:  [id number]

## 2011-11-19 NOTE — Care Management Note (Signed)
    Page 1 of 1   11/19/2011     11:44:47 AM   CARE MANAGEMENT NOTE 11/19/2011  Patient:  Yvette Graham, Yvette Graham   Account Number:  [id number]  Date Initiated:  11/18/2011  Documentation initiated by:  Jacquelynn Cree  Subjective/Objective Assessment:   Admitted with possible stroke or seizure sx. Lives with spouse.     Action/Plan:   Anticipated DC Date:  11/19/2011   Anticipated DC Plan:  HOME/SELF CARE      DC Planning Services  CM consult      Choice offered to / List presented to:             Status of service:  Completed, signed off Medicare Important Message given?   (If response is "NO", the following Medicare IM given date fields will be blank) Date Medicare IM given:   Date Additional Medicare IM given:    Discharge Disposition:  HOME/SELF CARE  Per UR Regulation:  Reviewed for med. necessity/level of care/duration of stay  If discussed at Long Length of Stay Meetings, dates discussed:    Comments:  PCP Dr. Wylene Simmer

## 2011-11-19 NOTE — Discharge Summary (Signed)
DISCHARGE SUMMARY  Yvette Graham  MR#: [id number]  DOB:04/11/1951  Date of Admission: 11/17/2011 Date of Discharge: 11/19/2011  Attending Physician:Yvette Graham Graham  Patient's PCP:Yvette Graham W, MD, MD  Consults:Treatment Team:  Neuro1 [facility], MD  Discharge Diagnoses: AMS, ? Hypoglycemia, EEG result pending DM2 HTN Hyperlipidemia Depression   Discharge Medications:        TAKE these medications         aspirin EC 325 MG tablet   Take 1 tablet (325 mg total) by mouth daily.      ezetimibe-simvastatin 10-40 MG per tablet   Commonly known as: VYTORIN   Take 1 tablet by mouth at bedtime.      insulin lispro 100 UNIT/ML injection   Commonly known as: novoLOG   Inject 0-15 Units into the skin 3 (three) times daily with meals.      insulin glargine 100 UNIT/ML injection   Commonly known as: LANTUS   Inject 20 Units into the skin at bedtime.      lisinopril 10 MG tablet   Commonly known as: PRINIVIL,ZESTRIL   Take 10 mg by mouth at bedtime.      pioglitazone 30 MG tablet   Commonly known as: ACTOS   Take 30 mg by mouth daily.            Hospital Procedures: Dg Chest 2 View  11/16/2011  *RADIOLOGY REPORT*  Clinical Data: Confusion  CHEST - 2 VIEW  Comparison: None.  Findings: Normal heart size.  Clear lungs.  Increased AP diameter of the chest.  Bronchitic changes.  No pneumothorax and no pleural effusion.  IMPRESSION: Findings related to COPD are noted but there is no active cardiopulmonary disease.  Original Report Authenticated By: Yvette Graham, M.D.    History of Present Illness: Yvette Graham was admitted after a third AMS episode.  Had documented normal CBG at time of admit, then was slightly low in ER>  EEG performed, result pending.  Was allowed to take 18 units of her own Humalog off protocol by nursing last evening for a CBG in the 400's.  No MD was called, ended up having a subsequent low requiring glucose administration.  Had AMS, but somehow felt  to be different than her other events.  WE had a long discussion about following written sliding scale and she may be doing this more by feel than testing.  At the high end, also her home meter was off by about 60 points.  A different Neurohospitalist than was seen in the ER saw her.  The EEG result is still pending.  He will make the decision about outpatient Neuro followup as well as addition of Dilantin if EEG is positive prior to her discharge.  Day of Discharge Exam BP 110/68  Pulse 68  Temp(Src) 97.6 F (36.4 C) (Oral)  Resp 18  Ht 5' 2" (1.575 m)  Wt 77.7 kg (171 lb 4.8 oz)  BMI 31.33 kg/m2  SpO2 96%  Physical Exam: General appearance: alert, cooperative and appears stated age Eyes: no scleral icterus Throat: oropharynx moist without erythema Resp: clear to auscultation bilaterally Cardio: regular rate and rhythm, S1, S2 normal, no murmur, click, rub or gallop GI: soft, non-tender; bowel sounds normal; no masses,  no organomegaly Extremities: no clubbing, cyanosis or edema  Discharge Labs:  Basename 11/18/11 0550 11/17/11 1447 11/17/11 0928  NA 137 -- 142  K 4.0 -- 3.9  CL 102 -- 106  CO2 26 -- 28  GLUCOSE 288* -- 63*  BUN   15 -- 12  CREATININE 0.62 0.61 --  CALCIUM 9.5 -- 9.7  MG -- -- --  PHOS -- -- --    Basename 11/18/11 0550 11/17/11 0928  AST 20 22  ALT 14 16  ALKPHOS 81 82  BILITOT 0.4 0.3  PROT 6.5 6.6  ALBUMIN 3.4* 3.6    Basename 11/18/11 0550 11/17/11 1447 11/17/11 0928  WBC 4.6 7.2 --  NEUTROABS -- -- 5.9  HGB 13.6 13.1 --  HCT 40.5 38.3 --  MCV 92.3 91.0 --  PLT 197 217 --   Lab Results  Component Value Date   INR 0.91 11/16/2011   No results found for this basename: CKTOTAL:3,CKMB:3,CKMBINDEX:3,TROPONINI:3 in the last 72 hours  Basename 11/18/11 0550  TSH 2.383   --   --   --   Discharge instructions: Follow the SSI precisely, use her other meter, which is new and check with her current meter periodically to see if they are  discordant on the low end as well.     Disposition: Home  Follow-up Appts: Follow-up with Dr.Elfie Graham at [facility] in 1 week.  She is to bring in both meters and all testing supplies and insulin as well.  Call for appointment.  Condition on Discharge:Stable   Signed: Kaylenn Graham Graham 11/19/2011, 8:22 AM   

## 2011-11-19 NOTE — Progress Notes (Signed)
DISCHARGE SUMMARY  POCAHONTAS COHENOUR  MR#: [id number]  DOB:1951-07-01  Date of Admission: 11/17/2011 Date of Discharge: 11/19/2011  Attending Physician:Jonn Chaikin W  Patient's WUJ:WJXBJYN,WGNFAOZ W, MD, MD  Consults:Treatment Team:  Kym Groom, MD  Discharge Diagnoses: AMS, ? Hypoglycemia, EEG result pending DM2 HTN Hyperlipidemia Depression   Discharge Medications:        TAKE these medications         aspirin EC 325 MG tablet   Take 1 tablet (325 mg total) by mouth daily.      ezetimibe-simvastatin 10-40 MG per tablet   Commonly known as: VYTORIN   Take 1 tablet by mouth at bedtime.      insulin lispro 100 UNIT/ML injection   Commonly known as: novoLOG   Inject 0-15 Units into the skin 3 (three) times daily with meals.      insulin glargine 100 UNIT/ML injection   Commonly known as: LANTUS   Inject 20 Units into the skin at bedtime.      lisinopril 10 MG tablet   Commonly known as: PRINIVIL,ZESTRIL   Take 10 mg by mouth at bedtime.      pioglitazone 30 MG tablet   Commonly known as: ACTOS   Take 30 mg by mouth daily.            Hospital Procedures: Dg Chest 2 View  11/16/2011  *RADIOLOGY REPORT*  Clinical Data: Confusion  CHEST - 2 VIEW  Comparison: None.  Findings: Normal heart size.  Clear lungs.  Increased AP diameter of the chest.  Bronchitic changes.  No pneumothorax and no pleural effusion.  IMPRESSION: Findings related to COPD are noted but there is no active cardiopulmonary disease.  Original Report Authenticated By: Donavan Burnet, M.D.    History of Present Illness: Yvette Graham was admitted after a third AMS episode.  Had documented normal CBG at time of admit, then was slightly low in ER>  EEG performed, result pending.  Was allowed to take 18 units of her own Humalog off protocol by nursing last evening for a CBG in the 400's.  No MD was called, ended up having a subsequent low requiring glucose administration.  Had AMS, but somehow felt  to be different than her other events.  WE had a long discussion about following written sliding scale and she may be doing this more by feel than testing.  At the high end, also her home meter was off by about 60 points.  A different Neurohospitalist than was seen in the ER saw her.  The EEG result is still pending.  He will make the decision about outpatient Neuro followup as well as addition of Dilantin if EEG is positive prior to her discharge.  Day of Discharge Exam BP 110/68  Pulse 68  Temp(Src) 97.6 F (36.4 C) (Oral)  Resp 18  Ht 5\' 2"  (1.575 m)  Wt 77.7 kg (171 lb 4.8 oz)  BMI 31.33 kg/m2  SpO2 96%  Physical Exam: General appearance: alert, cooperative and appears stated age Eyes: no scleral icterus Throat: oropharynx moist without erythema Resp: clear to auscultation bilaterally Cardio: regular rate and rhythm, S1, S2 normal, no murmur, click, rub or gallop GI: soft, non-tender; bowel sounds normal; no masses,  no organomegaly Extremities: no clubbing, cyanosis or edema  Discharge Labs:  [facility] 11/18/11 0550 11/17/11 1447 11/17/11 0928  NA 137 -- 142  K 4.0 -- 3.9  CL 102 -- 106  CO2 26 -- 28  GLUCOSE 288* -- 63*  BUN  15 -- 12  CREATININE 0.62 0.61 --  CALCIUM 9.5 -- 9.7  MG -- -- --  PHOS -- -- --    Basename 11/18/11 0550 11/17/11 0928  AST 20 22  ALT 14 16  ALKPHOS 81 82  BILITOT 0.4 0.3  PROT 6.5 6.6  ALBUMIN 3.4* 3.6    Basename 11/18/11 0550 11/17/11 1447 11/17/11 0928  WBC 4.6 7.2 --  NEUTROABS -- -- 5.9  HGB 13.6 13.1 --  HCT 40.5 38.3 --  MCV 92.3 91.0 --  PLT 197 217 --   Lab Results  Component Value Date   INR 0.91 11/16/2011   No results found for this basename: CKTOTAL:3,CKMB:3,CKMBINDEX:3,TROPONINI:3 in the last 72 hours  Basename 11/18/11 0550  TSH 2.383   --   --   --   Discharge instructions: Follow the SSI precisely, use her other meter, which is new and check with her current meter periodically to see if they are  discordant on the low end as well.     Disposition: Home  Follow-up Appts: Follow-up with Dr.Seraya Jobst at [facility] in 1 week.  She is to bring in both meters and all testing supplies and insulin as well.  Call for appointment.  Condition on Discharge:Stable   Signed: Gaspar Garbe 11/19/2011, 8:22 AM

## 2011-11-19 NOTE — Progress Notes (Signed)
Pt d/c to home by car with family, assessment stable. Informed pt to pick up her insulin prescription at the pharmacy listed on her d/c instructions. Pt stated she had plenty of insulin at home and did not need to pick this up. RN called Dr. Wylene Simmer to obtain sliding scale home orders. RN was told to use the sliding scale that was ordered in the hospital. This was written on the d/c instructions and provided to the patient. Pt able to verbally state how much insulin she needs to take and when.

## 2011-11-19 NOTE — Progress Notes (Signed)
Pt gave herself 5 units of humalog instead of 5 units of novolog for a CBG of 236.

## 2011-11-19 NOTE — Progress Notes (Addendum)
Pt's CBG at 2200 was 422,pt gave herself 18units of Humalog Insulin(pt's own medication),said to be allergic to ordered Novalog,also had 20units of Lantus at 2239.At about 2335,pt called that she was not feeling well,CBG read 17,pt very lethergic and sweaty but respond to calls,iv D50% immediately given and CBG repeated at 2351,read 184,pt became more alert and talking,said did not remember what happened,Dr Perini (on call) paged and notified of events,ordered to monitor CBG every hour for couple of hours,v/s read Temp 97.8,Pulse 71, Resp 18 and BP 98/62,96% on RA oxygen saturation,pt and family reassured,will however continue to monitor. Obasogie-Asidi, Densil Ottey Efe Pt's CBG was done again at 0110,read 45,repeated and it read 53, pt quiet in bed,denies any discomfort,orange juice and couple of crackers given,CBG came up to 161 at 0200 and 185 at 0300,will continue to monitor. Obasogie-Asidi, Susano Cleckler Efe

## 2011-11-19 NOTE — Progress Notes (Signed)
Subjective: No seizure-like spells overnight. Did have an episode of severe hypoglycemia with altered mental status, including slurred speech, overnight. No loss of bowel or bladder continence.   Objective: Current vital signs: BP 110/68  Pulse 68  Temp(Src) 97.6 F (36.4 C) (Oral)  Resp 18  Ht 5\' 2"  (1.575 m)  Wt 77.7 kg (171 lb 4.8 oz)  BMI 31.33 kg/m2  SpO2 96% Vital signs in last 24 hours: Temp:  [97.1 F (36.2 C)-97.8 F (36.6 C)] 97.6 F (36.4 C) (05/16 0711) Pulse Rate:  [66-88] 68  (05/16 0711) Resp:  [18] 18  (05/16 0711) BP: (98-135)/(60-69) 110/68 mmHg (05/16 0711) SpO2:  [96 %-98 %] 96 % (05/16 0711)  Intake/Output from previous day: 05/15 0701 - 05/16 0700 In: 1120 [P.O.:1120] Out: -  Intake/Output this shift:   Nutritional status: Carb Control  Neurologic Exam: Speech clear and fluent with intact comprehension. No facial droop, nystagmus or hoarseness. Symmetric upper extremity movement bilaterally.   Lab Results: Results for orders placed during the hospital encounter of 11/17/11 (from the past 48 hour(s))  URINE RAPID DRUG SCREEN (HOSP PERFORMED)     Status: Normal   Collection Time   11/17/11  9:04 AM      Component Value Range Comment   Opiates NONE DETECTED  NONE DETECTED     Cocaine NONE DETECTED  NONE DETECTED     Benzodiazepines NONE DETECTED  NONE DETECTED     Amphetamines NONE DETECTED  NONE DETECTED     Tetrahydrocannabinol NONE DETECTED  NONE DETECTED     Barbiturates NONE DETECTED  NONE DETECTED    GLUCOSE, CAPILLARY     Status: Abnormal   Collection Time   11/17/11  9:07 AM      Component Value Range Comment   Glucose-Capillary 52 (*) 70 - 99 (mg/dL)    Comment 1 Documented in Chart      Comment 2 Notify RN     CBC     Status: Normal   Collection Time   11/17/11  9:28 AM      Component Value Range Comment   WBC 7.4  4.0 - 10.5 (K/uL)    RBC 4.26  3.87 - 5.11 (MIL/uL)    Hemoglobin 13.4  12.0 - 15.0 (g/dL)    HCT 40.9  81.1 - 91.4  (%)    MCV 91.1  78.0 - 100.0 (fL)    MCH 31.5  26.0 - 34.0 (pg)    MCHC 34.5  30.0 - 36.0 (g/dL)    RDW 78.2  95.6 - 21.3 (%)    Platelets 237  150 - 400 (K/uL)   DIFFERENTIAL     Status: Abnormal   Collection Time   11/17/11  9:28 AM      Component Value Range Comment   Neutrophils Relative 80 (*) 43 - 77 (%)    Neutro Abs 5.9  1.7 - 7.7 (K/uL)    Lymphocytes Relative 14  12 - 46 (%)    Lymphs Abs 1.0  0.7 - 4.0 (K/uL)    Monocytes Relative 6  3 - 12 (%)    Monocytes Absolute 0.4  0.1 - 1.0 (K/uL)    Eosinophils Relative 1  0 - 5 (%)    Eosinophils Absolute 0.1  0.0 - 0.7 (K/uL)    Basophils Relative 0  0 - 1 (%)    Basophils Absolute 0.0  0.0 - 0.1 (K/uL)   COMPREHENSIVE METABOLIC PANEL     Status: Abnormal  Collection Time   11/17/11  9:28 AM      Component Value Range Comment   Sodium 142  135 - 145 (mEq/L)    Potassium 3.9  3.5 - 5.1 (mEq/L)    Chloride 106  96 - 112 (mEq/L)    CO2 28  19 - 32 (mEq/L)    Glucose, Bld 63 (*) 70 - 99 (mg/dL)    BUN 12  6 - 23 (mg/dL)    Creatinine, Ser 9.81  0.50 - 1.10 (mg/dL)    Calcium 9.7  8.4 - 10.5 (mg/dL)    Total Protein 6.6  6.0 - 8.3 (g/dL)    Albumin 3.6  3.5 - 5.2 (g/dL)    AST 22  0 - 37 (U/L)    ALT 16  0 - 35 (U/L)    Alkaline Phosphatase 82  39 - 117 (U/L)    Total Bilirubin 0.3  0.3 - 1.2 (mg/dL)    GFR calc non Af Amer >90  >90 (mL/min)    GFR calc Af Amer >90  >90 (mL/min)   GLUCOSE, CAPILLARY     Status: Normal   Collection Time   11/17/11 10:11 AM      Component Value Range Comment   Glucose-Capillary 77  70 - 99 (mg/dL)    Comment 1 Documented in Chart      Comment 2 Notify RN     CBC     Status: Normal   Collection Time   11/17/11  2:47 PM      Component Value Range Comment   WBC 7.2  4.0 - 10.5 (K/uL)    RBC 4.21  3.87 - 5.11 (MIL/uL)    Hemoglobin 13.1  12.0 - 15.0 (g/dL)    HCT 19.1  47.8 - 29.5 (%)    MCV 91.0  78.0 - 100.0 (fL)    MCH 31.1  26.0 - 34.0 (pg)    MCHC 34.2  30.0 - 36.0 (g/dL)    RDW  62.1  30.8 - 65.7 (%)    Platelets 217  150 - 400 (K/uL)   CREATININE, SERUM     Status: Normal   Collection Time   11/17/11  2:47 PM      Component Value Range Comment   Creatinine, Ser 0.61  0.50 - 1.10 (mg/dL)    GFR calc non Af Amer >90  >90 (mL/min)    GFR calc Af Amer >90  >90 (mL/min)   GLUCOSE, CAPILLARY     Status: Abnormal   Collection Time   11/17/11  5:50 PM      Component Value Range Comment   Glucose-Capillary 271 (*) 70 - 99 (mg/dL)    Comment 1 Documented in Chart      Comment 2 Notify RN     GLUCOSE, CAPILLARY     Status: Abnormal   Collection Time   11/17/11 10:10 PM      Component Value Range Comment   Glucose-Capillary 291 (*) 70 - 99 (mg/dL)    Comment 1 Notify RN     CBC     Status: Normal   Collection Time   11/18/11  5:50 AM      Component Value Range Comment   WBC 4.6  4.0 - 10.5 (K/uL)    RBC 4.39  3.87 - 5.11 (MIL/uL)    Hemoglobin 13.6  12.0 - 15.0 (g/dL)    HCT 84.6  96.2 - 95.2 (%)    MCV 92.3  78.0 - 100.0 (fL)  MCH 31.0  26.0 - 34.0 (pg)    MCHC 33.6  30.0 - 36.0 (g/dL)    RDW 86.5  78.4 - 69.6 (%)    Platelets 197  150 - 400 (K/uL)   COMPREHENSIVE METABOLIC PANEL     Status: Abnormal   Collection Time   11/18/11  5:50 AM      Component Value Range Comment   Sodium 137  135 - 145 (mEq/L)    Potassium 4.0  3.5 - 5.1 (mEq/L)    Chloride 102  96 - 112 (mEq/L)    CO2 26  19 - 32 (mEq/L)    Glucose, Bld 288 (*) 70 - 99 (mg/dL)    BUN 15  6 - 23 (mg/dL)    Creatinine, Ser 2.95  0.50 - 1.10 (mg/dL)    Calcium 9.5  8.4 - 10.5 (mg/dL)    Total Protein 6.5  6.0 - 8.3 (g/dL)    Albumin 3.4 (*) 3.5 - 5.2 (g/dL)    AST 20  0 - 37 (U/L)    ALT 14  0 - 35 (U/L)    Alkaline Phosphatase 81  39 - 117 (U/L)    Total Bilirubin 0.4  0.3 - 1.2 (mg/dL)    GFR calc non Af Amer >90  >90 (mL/min)    GFR calc Af Amer >90  >90 (mL/min)   TSH     Status: Normal   Collection Time   11/18/11  5:50 AM      Component Value Range Comment   TSH 2.383  0.350 - 4.500  (uIU/mL)   CORTISOL-AM, BLOOD     Status: Normal   Collection Time   11/18/11  5:50 AM      Component Value Range Comment   Cortisol - AM 21.7  4.3 - 22.4 (ug/dL)   GLUCOSE, CAPILLARY     Status: Abnormal   Collection Time   11/18/11  6:59 AM      Component Value Range Comment   Glucose-Capillary 278 (*) 70 - 99 (mg/dL)    Comment 1 Notify RN     GLUCOSE, CAPILLARY     Status: Abnormal   Collection Time   11/18/11 11:28 AM      Component Value Range Comment   Glucose-Capillary 158 (*) 70 - 99 (mg/dL)   GLUCOSE, CAPILLARY     Status: Abnormal   Collection Time   11/18/11  4:26 PM      Component Value Range Comment   Glucose-Capillary 349 (*) 70 - 99 (mg/dL)   GLUCOSE, CAPILLARY     Status: Abnormal   Collection Time   11/18/11  9:42 PM      Component Value Range Comment   Glucose-Capillary 422 (*) 70 - 99 (mg/dL)   GLUCOSE, CAPILLARY     Status: Abnormal   Collection Time   11/18/11 11:39 PM      Component Value Range Comment   Glucose-Capillary 17 (*) 70 - 99 (mg/dL)   GLUCOSE, CAPILLARY     Status: Abnormal   Collection Time   11/18/11 11:51 PM      Component Value Range Comment   Glucose-Capillary 184 (*) 70 - 99 (mg/dL)   GLUCOSE, CAPILLARY     Status: Abnormal   Collection Time   11/19/11  1:11 AM      Component Value Range Comment   Glucose-Capillary 45 (*) 70 - 99 (mg/dL)   GLUCOSE, CAPILLARY     Status: Abnormal   Collection Time  11/19/11  1:13 AM      Component Value Range Comment   Glucose-Capillary 53 (*) 70 - 99 (mg/dL)   GLUCOSE, CAPILLARY     Status: Normal   Collection Time   11/19/11  1:41 AM      Component Value Range Comment   Glucose-Capillary 90  70 - 99 (mg/dL)   GLUCOSE, CAPILLARY     Status: Abnormal   Collection Time   11/19/11  2:04 AM      Component Value Range Comment   Glucose-Capillary 161 (*) 70 - 99 (mg/dL)   GLUCOSE, CAPILLARY     Status: Abnormal   Collection Time   11/19/11  3:06 AM      Component Value Range Comment    Glucose-Capillary 185 (*) 70 - 99 (mg/dL)   GLUCOSE, CAPILLARY     Status: Abnormal   Collection Time   11/19/11  7:15 AM      Component Value Range Comment   Glucose-Capillary 236 (*) 70 - 99 (mg/dL)     No results found for this or any previous visit (from the past 240 hour(s)).  Lipid Panel No results found for this basename: CHOL,TRIG,HDL,CHOLHDL,VLDL,LDLCALC in the last 72 hours  Studies/Results: No results found.  Medications:  Scheduled:   . aspirin EC  325 mg Oral Daily  . dextrose      . enoxaparin  40 mg Subcutaneous Q24H  . ezetimibe-simvastatin  1 tablet Oral QHS  . insulin aspart  0-15 Units Subcutaneous TID WC  . insulin glargine  20 Units Subcutaneous QHS  . lisinopril  10 mg Oral QHS  . pioglitazone  30 mg Oral Daily  . sodium chloride  3 mL Intravenous Q12H  . sodium chloride  3 mL Intravenous Q12H    Assessment/Plan:  Spells of AMS, including severe episode with fecal incontinence at home. EEG negative. Most likely her spells were due to severe hypoglycemia. I have discussed the case with the patient as well as with Dr. Wylene Simmer. No indication for initiation of an  anticonvulsant or 24 hour EEG at this time.   Signing off, please call neurology if there are further questions.    LOS: 2 days   @Electronically  signed: Dr. Caryl Pina 11/19/2011  8:45 AM

## 2013-04-07 IMAGING — CR DG CHEST 2V
2 series · 2 of 2 positions shown · non-contrast
Comparison: None.

CLINICAL DATA: Confusion

CHEST - 2 VIEW

[w chest pa]
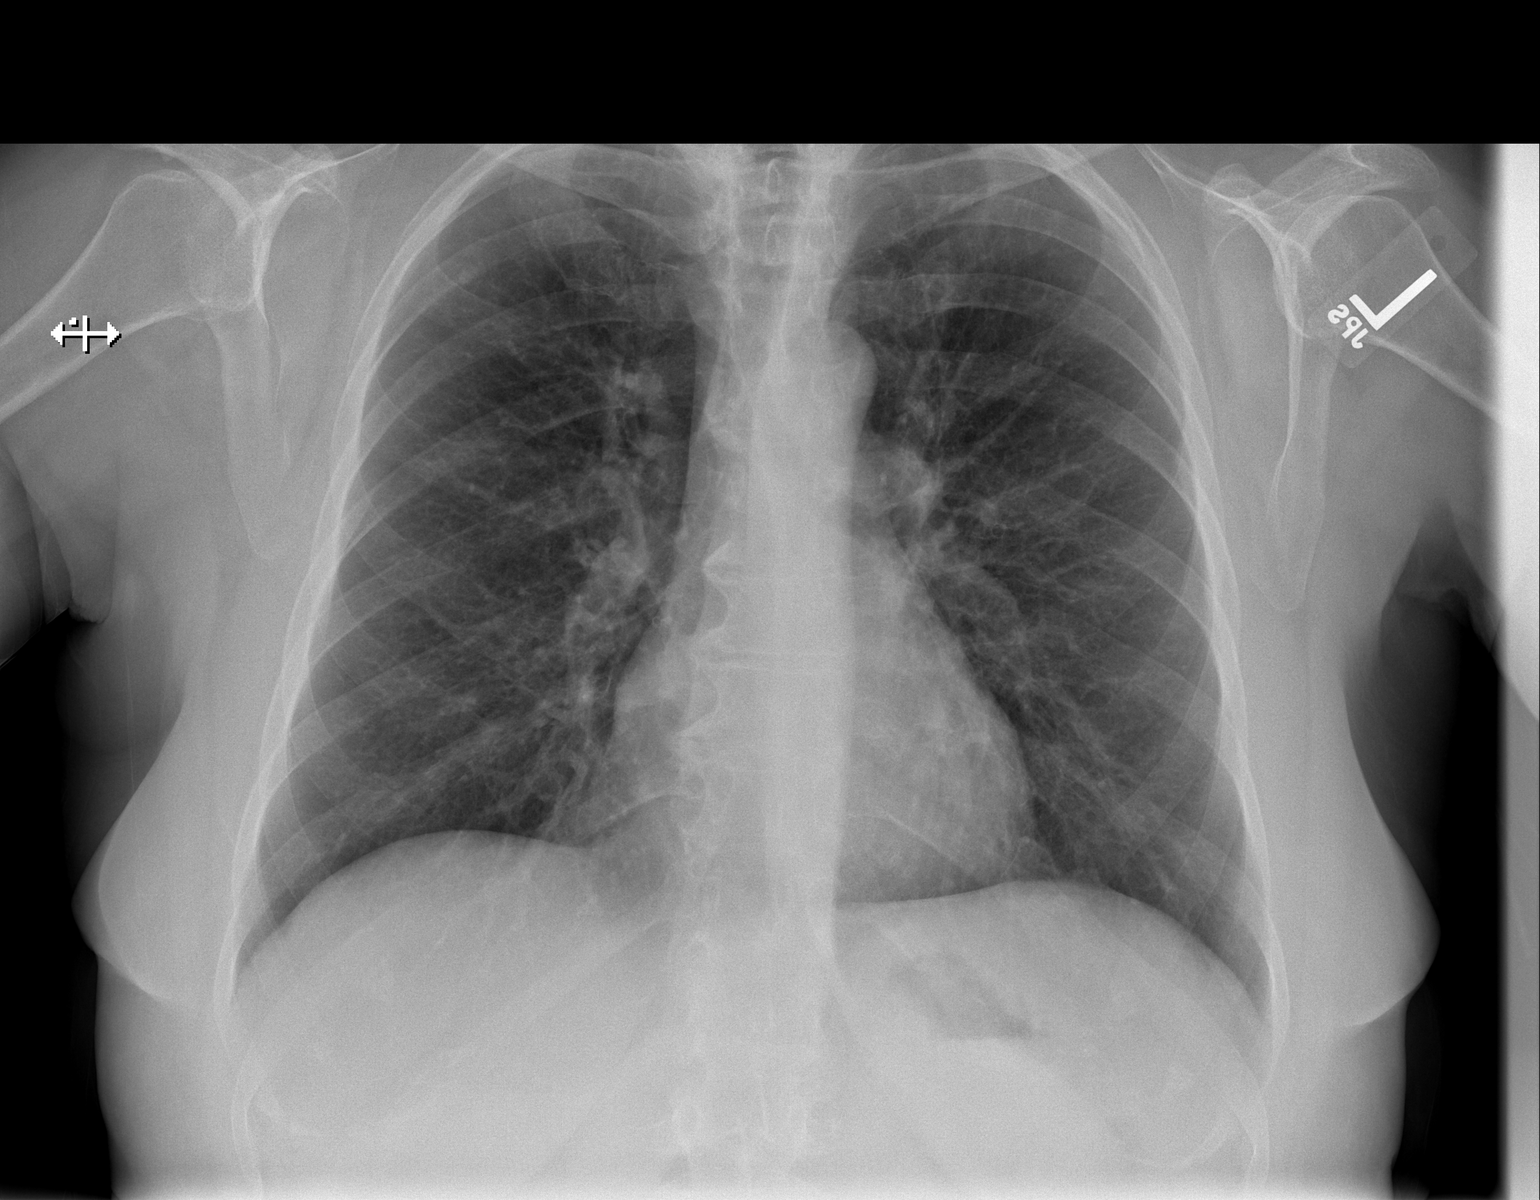

[w chest lat]
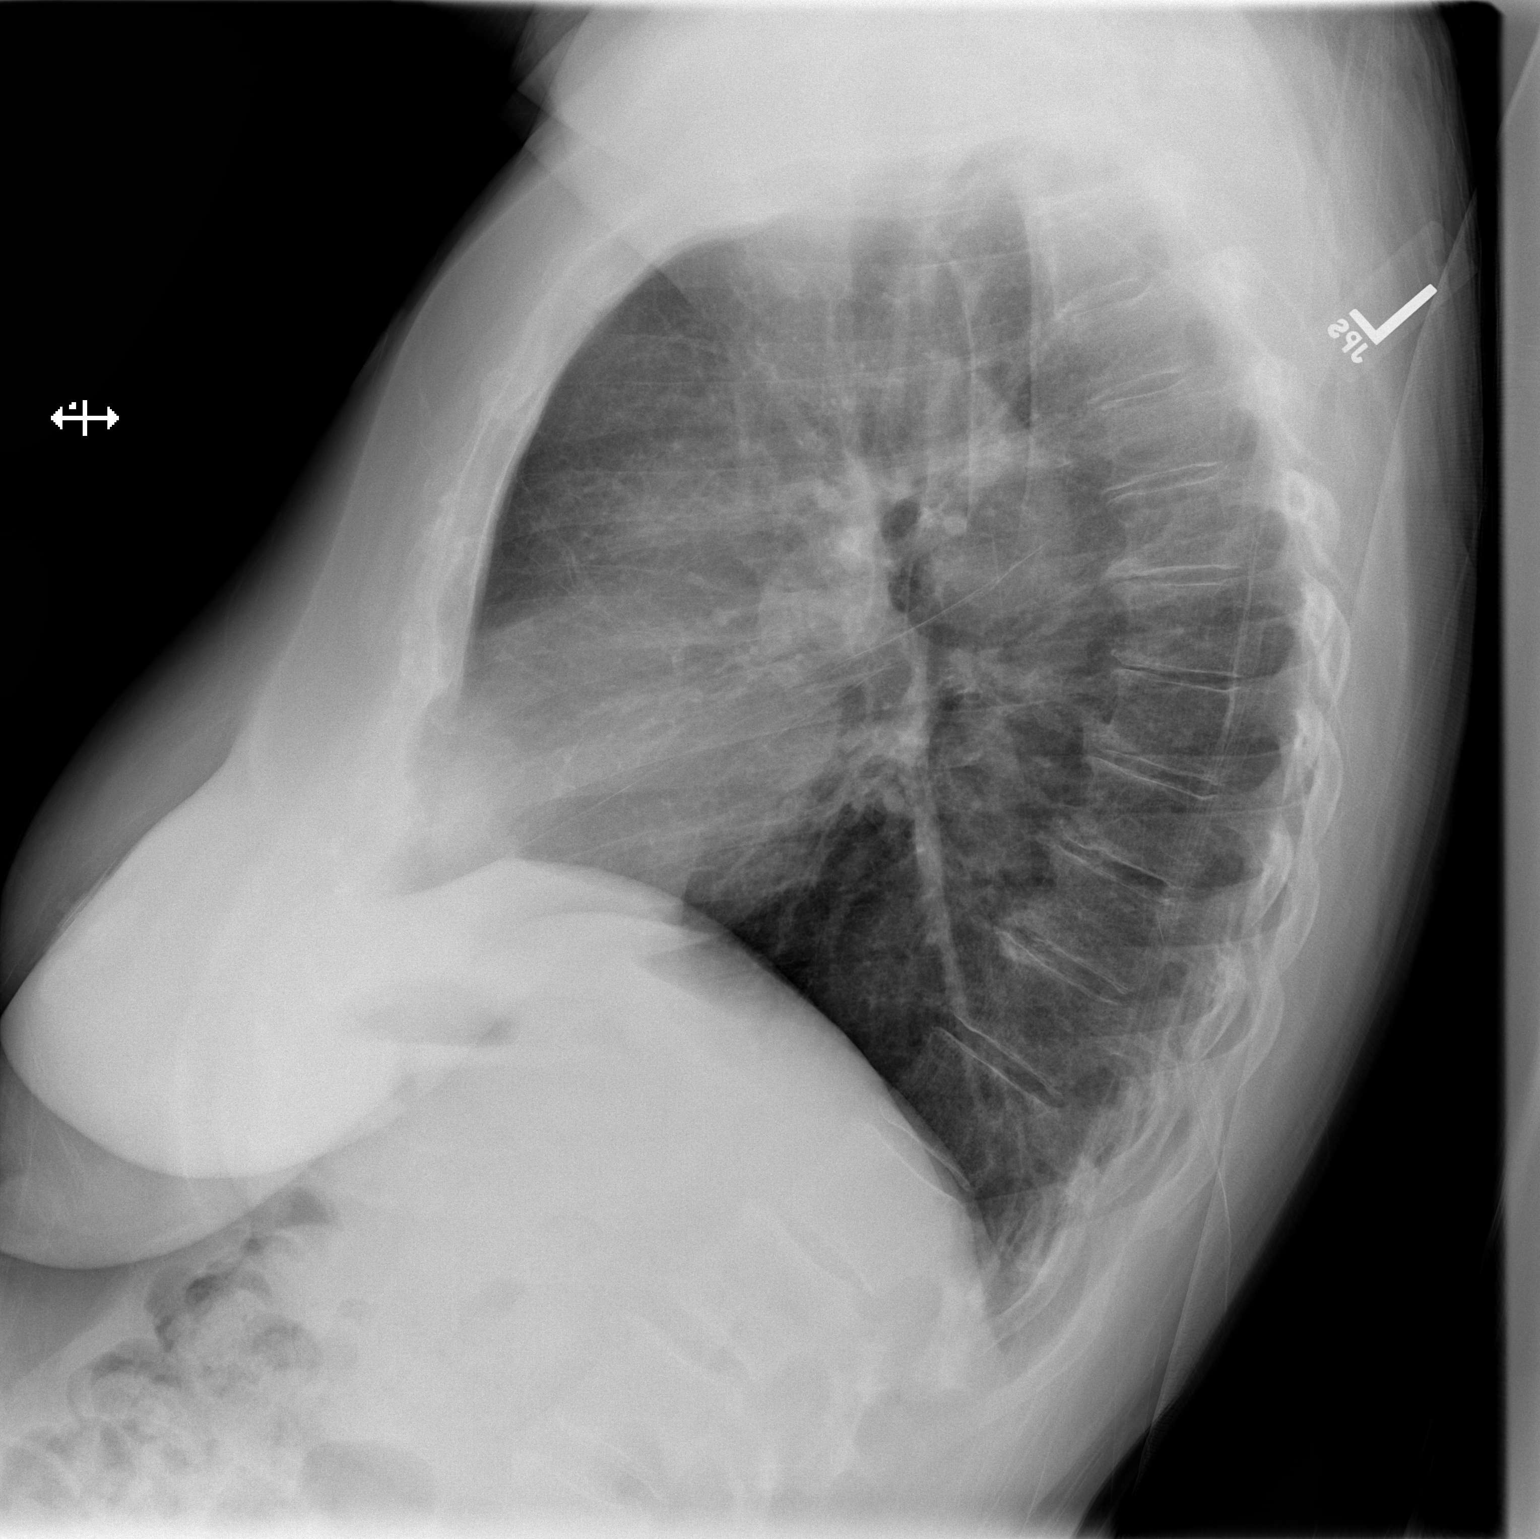

[2 of 2 positions shown; findings below may reference images not displayed]

FINDINGS: Normal heart size.  Clear lungs.  Increased AP diameter
of the chest.  Bronchitic changes.  No pneumothorax and no pleural
effusion.
IMPRESSION: Findings related to COPD are noted but there is no active
cardiopulmonary disease.

## 2013-08-09 ENCOUNTER — Ambulatory Visit: Payer: Self-pay | Admitting: Women's Health

## 2013-09-12 ENCOUNTER — Ambulatory Visit: Payer: Self-pay | Admitting: Women's Health

## 2013-10-11 ENCOUNTER — Ambulatory Visit (INDEPENDENT_AMBULATORY_CARE_PROVIDER_SITE_OTHER): Payer: BC Managed Care – PPO | Admitting: Women's Health

## 2013-10-11 ENCOUNTER — Other Ambulatory Visit (HOSPITAL_COMMUNITY)
Admission: RE | Admit: 2013-10-11 | Discharge: 2013-10-11 | Disposition: A | Discharge status: Home / Self Care | Payer: BC Managed Care – PPO | Source: Ambulatory Visit | Attending: Women's Health | Admitting: Women's Health

## 2013-10-11 ENCOUNTER — Encounter: Payer: Self-pay | Admitting: Women's Health

## 2013-10-11 VITALS — BP 120/90 | Ht 62.0 in | Wt 171.2 lb

## 2013-10-11 DIAGNOSIS — I10 Essential (primary) hypertension: Secondary | ICD-10-CM

## 2013-10-11 DIAGNOSIS — E119 Type 2 diabetes mellitus without complications: Secondary | ICD-10-CM

## 2013-10-11 DIAGNOSIS — Z124 Encounter for screening for malignant neoplasm of cervix: Secondary | ICD-10-CM | POA: Insufficient documentation

## 2013-10-11 DIAGNOSIS — R87810 Cervical high risk human papillomavirus (HPV) DNA test positive: Secondary | ICD-10-CM | POA: Insufficient documentation

## 2013-10-11 DIAGNOSIS — Z780 Asymptomatic menopausal state: Secondary | ICD-10-CM

## 2013-10-11 DIAGNOSIS — Z1151 Encounter for screening for human papillomavirus (HPV): Secondary | ICD-10-CM | POA: Insufficient documentation

## 2013-10-11 DIAGNOSIS — Z01419 Encounter for gynecological examination (general) (routine) without abnormal findings: Secondary | ICD-10-CM

## 2013-10-11 DIAGNOSIS — N83209 Unspecified ovarian cyst, unspecified side: Secondary | ICD-10-CM

## 2013-10-11 NOTE — Addendum Note (Signed)
Addended by: Harrington ChallengerYOUNG, NANCY J on: 10/11/2013 05:37 PM   Modules accepted: Orders

## 2013-10-11 NOTE — Patient Instructions (Signed)
Health Recommendations for Postmenopausal Women Respected and ongoing research has looked at the most common causes of death, disability, and poor quality of life in postmenopausal women. The causes include heart disease, diseases of blood vessels, diabetes, depression, cancer, and bone loss (osteoporosis). Many things can be done to help lower the chances of developing these and other common problems: CARDIOVASCULAR DISEASE Heart Disease: A heart attack is a medical emergency. Know the signs and symptoms of a heart attack. Below are things women can do to reduce their risk for heart disease.   Do not smoke. If you smoke, quit.  Aim for a healthy weight. Being overweight causes many preventable deaths. Eat a healthy and balanced diet and drink an adequate amount of liquids.  Get moving. Make a commitment to be more physically active. Aim for 30 minutes of activity on most, if not all days of the week.  Eat for heart health. Choose a diet that is low in saturated fat and cholesterol and eliminate trans fat. Include whole grains, vegetables, and fruits. Read and understand the labels on food containers before buying.  Know your numbers. Ask your caregiver to check your blood pressure, cholesterol (total, HDL, LDL, triglycerides) and blood glucose. Work with your caregiver on improving your entire clinical picture.  High blood pressure. Limit or stop your table salt intake (try salt substitute and food seasonings). Avoid salty foods and drinks. Read labels on food containers before buying. Eating well and exercising can help control high blood pressure. STROKE  Stroke is a medical emergency. Stroke may be the result of a blood clot in a blood vessel in the brain or by a brain hemorrhage (bleeding). Know the signs and symptoms of a stroke. To lower the risk of developing a stroke:  Avoid fatty foods.  Quit smoking.  Control your diabetes, blood pressure, and irregular heart rate. THROMBOPHLEBITIS  (BLOOD CLOT) OF THE LEG  Becoming overweight and leading a stationary lifestyle may also contribute to developing blood clots. Controlling your diet and exercising will help lower the risk of developing blood clots. CANCER SCREENING  Breast Cancer: Take steps to reduce your risk of breast cancer.  You should practice "breast self-awareness." This means understanding the normal appearance and feel of your breasts and should include breast self-examination. Any changes detected, no matter how small, should be reported to your caregiver.  After age 40, you should have a clinical breast exam (CBE) every year.  Starting at age 40, you should consider having a mammogram (breast X-ray) every year.  If you have a family history of breast cancer, talk to your caregiver about genetic screening.  If you are at high risk for breast cancer, talk to your caregiver about having an MRI and a mammogram every year.  Intestinal or Stomach Cancer: Tests to consider are a rectal exam, fecal occult blood, sigmoidoscopy, and colonoscopy. Women who are high risk may need to be screened at an earlier age and more often.  Cervical Cancer:  Beginning at age 30, you should have a Pap test every 3 years as long as the past 3 Pap tests have been normal.  If you have had past treatment for cervical cancer or a condition that could lead to cancer, you need Pap tests and screening for cancer for at least 20 years after your treatment.  If you had a hysterectomy for a problem that was not cancer or a condition that could lead to cancer, then you no longer need Pap tests.    If you are between ages 65 and 70, and you have had normal Pap tests going back 10 years, you no longer need Pap tests.  If Pap tests have been discontinued, risk factors (such as a new sexual partner) need to be reassessed to determine if screening should be resumed.  Some medical problems can increase the chance of getting cervical cancer. In these  cases, your caregiver may recommend more frequent screening and Pap tests.  Uterine Cancer: If you have vaginal bleeding after reaching menopause, you should notify your caregiver.  Ovarian cancer: Other than yearly pelvic exams, there are no reliable tests available to screen for ovarian cancer at this time except for yearly pelvic exams.  Lung Cancer: Yearly chest X-rays can detect lung cancer and should be done on high risk women, such as cigarette smokers and women with chronic lung disease (emphysema).  Skin Cancer: A complete body skin exam should be done at your yearly examination. Avoid overexposure to the sun and ultraviolet light lamps. Use a strong sun block cream when in the sun. All of these things are important in lowering the risk of skin cancer. MENOPAUSE Menopause Symptoms: Hormone therapy products are effective for treating symptoms associated with menopause:  Moderate to severe hot flashes.  Night sweats.  Mood swings.  Headaches.  Tiredness.  Loss of sex drive.  Insomnia.  Other symptoms. Hormone replacement carries certain risks, especially in older women. Women who use or are thinking about using estrogen or estrogen with progestin treatments should discuss that with their caregiver. Your caregiver will help you understand the benefits and risks. The ideal dose of hormone replacement therapy is not known. The Food and Drug Administration (FDA) has concluded that hormone therapy should be used only at the lowest doses and for the shortest amount of time to reach treatment goals.  OSTEOPOROSIS Protecting Against Bone Loss and Preventing Fracture: If you use hormone therapy for prevention of bone loss (osteoporosis), the risks for bone loss must outweigh the risk of the therapy. Ask your caregiver about other medications known to be safe and effective for preventing bone loss and fractures. To guard against bone loss or fractures, the following is recommended:  If  you are less than age 50, take 1000 mg of calcium and at least 600 mg of Vitamin D per day.  If you are greater than age 50 but less than age 70, take 1200 mg of calcium and at least 600 mg of Vitamin D per day.  If you are greater than age 70, take 1200 mg of calcium and at least 800 mg of Vitamin D per day. Smoking and excessive alcohol intake increases the risk of osteoporosis. Eat foods rich in calcium and vitamin D and do weight bearing exercises several times a week as your caregiver suggests. DIABETES Diabetes Melitus: If you have Type I or Type 2 diabetes, you should keep your blood sugar under control with diet, exercise and recommended medication. Avoid too many sweets, starchy and fatty foods. Being overweight can make control more difficult. COGNITION AND MEMORY Cognition and Memory: Menopausal hormone therapy is not recommended for the prevention of cognitive disorders such as Alzheimer's disease or memory loss.  DEPRESSION  Depression may occur at any age, but is common in elderly women. The reasons may be because of physical, medical, social (loneliness), or financial problems and needs. If you are experiencing depression because of medical problems and control of symptoms, talk to your caregiver about this. Physical activity and   exercise may help with mood and sleep. Community and volunteer involvement may help your sense of value and worth. If you have depression and you feel that the problem is getting worse or becoming severe, talk to your caregiver about treatment options that are best for you. ACCIDENTS  Accidents are common and can be serious in the elderly woman. Prepare your house to prevent accidents. Eliminate throw rugs, place hand bars in the bath, shower and toilet areas. Avoid wearing high heeled shoes or walking on wet, snowy, and icy areas. Limit or stop driving if you have vision or hearing problems, or you feel you are unsteady with you movements and  reflexes. HEPATITIS C Hepatitis C is a type of viral infection affecting the liver. It is spread mainly through contact with blood from an infected person. It can be treated, but if left untreated, it can lead to severe liver damage over years. Many people who are infected do not know that the virus is in their blood. If you are a "baby-boomer", it is recommended that you have one screening test for Hepatitis C. IMMUNIZATIONS  Several immunizations are important to consider having during your senior years, including:   Tetanus, diptheria, and pertussis booster shot.  Influenza every year before the flu season begins.  Pneumonia vaccine.  Shingles vaccine.  Others as indicated based on your specific needs. Talk to your caregiver about these. Document Released: 08/14/2005 Document Revised: 06/08/2012 Document Reviewed: 04/09/2008 [employer] Patient Information 2014 [employer].  

## 2013-10-11 NOTE — Progress Notes (Addendum)
Cecile Hearingerrie W Maiorino [birth year] [id number]    History:    Presents for annual exam.  Postmenopausal/no HRT/no bleeding. Normal Pap and mammogram history. Type 2 diabetes age 63 insulin-dependent an disposable every 24 hour insulin pump/hypertension-primary care manages. Has had Pneumovax and zostavac.  Past medical history, past surgical history, family history and social history were all reviewed and documented in the EPIC chart. 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 Sister died of ovarian cancer at 9045. Family history of lung cancer. Smokes 5 cigarettes/day trying to quit. Allergic to nicotine patch. 3 daughters all doing well.  ROS:  A  ROS was performed and pertinent positives and negatives are included. Denies postmenopausal bleeding.   Exam:  Filed Vitals:   10/11/13 1403  BP: 120/90    General appearance:  Normal, overweight, well appearing Thyroid:  Symmetrical, normal in size, without palpable masses or nodularity. Respiratory  Auscultation:  Clear without wheezing or rhonchi Cardiovascular  Auscultation:  Regular rate, without rubs, murmurs or gallops  Edema/varicosities:  Not grossly evident, few varicosities  Abdominal  Soft,nontender, without masses, guarding or rebound.  Liver/spleen:  No organomegaly noted  Hernia:  None appreciated  Skin  Inspection:  Grossly normal, few seborrhoic keratoses and skin tags   Breasts: Examined lying and sitting.     Right: Without masses, retractions, discharge or axillary adenopathy.     Left: Without masses, retractions, discharge or axillary adenopathy. Gentitourinary   Inguinal/mons:  Normal without inguinal adenopathy  External genitalia:  Normal  BUS/Urethra/Skene's glands:  Normal  Vagina: Atrophic  Cervix:  Normal  Uterus:  normal in size, shape and contour.  Midline and mobile  Adnexa/parametria:     Rt: Without masses or tenderness.   Lt: Without masses or tenderness.  Anus and perineum: Normal  Digital rectal  exam: Normal sphincter tone without palpated masses or tenderness  Assessment/Plan:  [age] y.o. MWF G3P3 for annual exam.   DM/hypertension primary care manages labs and meds Atrophic vaginitis  Plan: Schedule colonoscopy with [facility] or Dr. Loreta AveMann and discuss  management of  hypoglycemia with procedure (has not had colonoscopy due to fear of hypoglycemia.) Schedule Dexa scan. SBE's, continue annual mammogram, calcium rich diet, vitamin D 2000 daily. Reviewed importance of regular exercise, home safety and fall prevention discussed. Vaginal lubricants with intercourse. UA. Pap with HR HPV typing. Last Pap 2010 new Pap screening guidelines reviewed.  After review with Dr. Lily PeerFernandez will get annual pelvic ultrasound /sister with ovarian cancer age 63. Telephone call to patient will schedule.   Harrington Challengerancy J Calleigh Lafontant [facility] HospitalWHNP, 3:19 PM 10/11/2013

## 2013-10-24 ENCOUNTER — Encounter: Payer: Self-pay | Admitting: Obstetrics and Gynecology

## 2013-11-09 ENCOUNTER — Telehealth: Payer: Self-pay | Admitting: *Deleted

## 2013-11-09 NOTE — Telephone Encounter (Signed)
Pt has questions about C & B appointment on 11/14/13. All questions answered pt will be in for procedure.

## 2013-11-14 ENCOUNTER — Ambulatory Visit (INDEPENDENT_AMBULATORY_CARE_PROVIDER_SITE_OTHER): Payer: BC Managed Care – PPO | Admitting: Gynecology

## 2013-11-14 ENCOUNTER — Encounter: Payer: Self-pay | Admitting: Gynecology

## 2013-11-14 VITALS — BP 110/70

## 2013-11-14 DIAGNOSIS — R87618 Other abnormal cytological findings on specimens from cervix uteri: Secondary | ICD-10-CM

## 2013-11-14 DIAGNOSIS — B977 Papillomavirus as the cause of diseases classified elsewhere: Secondary | ICD-10-CM

## 2013-11-14 DIAGNOSIS — R8789 Other abnormal findings in specimens from female genital organs: Secondary | ICD-10-CM

## 2013-11-14 NOTE — Progress Notes (Signed)
   Patient is a 63 year old who presented to the office today as a result of abnormal Pap smear at time of her annual exam on 10/11/2013. Patient stated that she has never had any abnormal Pap smears in the past. Recent Pap smear demonstrated the following:  Normal Pap smear at high risk HPV was noted there was no genotype obtained. We discussed a new options elected to repeat the Pap smear with testing in the year will proceed with colposcopy since we do not have the genotype of HPV.  Patient underwent a detail colposcopic evaluation external genitalia vagina cervix and fornix acetic acid was applied a small acetowhite area was noted at 3:00 position which was biopsied. The transformation zone wasn't visualized. An ECC was obtained. Monsel solution was used for hemostasis.  Patient states that her sister was diagnosed with ovarian cancer at the age of 63 and died at the age of 63. She has been followed with annual Pap smears and periodic CA 125.  Assessment/plan: Recent Pap smear normal but positive for HPV genotype had not been done. Detail colposcopic exam today demonstrated the leukoplakic area at the 3:00 position of the cervix which was biopsied along with an ECC results pending at time of this dictation. We will await the results and manage accordingly.

## 2015-01-03 ENCOUNTER — Ambulatory Visit (INDEPENDENT_AMBULATORY_CARE_PROVIDER_SITE_OTHER): Payer: BLUE CROSS/BLUE SHIELD | Admitting: Podiatry

## 2015-01-03 ENCOUNTER — Ambulatory Visit (INDEPENDENT_AMBULATORY_CARE_PROVIDER_SITE_OTHER): Payer: BLUE CROSS/BLUE SHIELD

## 2015-01-03 ENCOUNTER — Encounter: Payer: Self-pay | Admitting: Podiatry

## 2015-01-03 VITALS — BP 147/86 | HR 69 | Resp 12

## 2015-01-03 DIAGNOSIS — M722 Plantar fascial fibromatosis: Secondary | ICD-10-CM

## 2015-01-03 MED ORDER — MELOXICAM 15 MG PO TABS: 15.0000 mg | ORAL_TABLET | Freq: Every day | ORAL | Status: DC

## 2015-01-03 NOTE — Progress Notes (Signed)
   Subjective:    Patient ID: Yvette Graham, female    DOB: 02-10-1951, 64 y.o.   MRN: [id number]  HPI  PT STATED B/L BOTTOM HEELS ARE PAINFUL FOR 7 MONTHS. FEET ARE GETTING WORSE ESPECIALLY GETTING UP IN THE MIDDLE OF THE NIGHT BUT START MORE IT GET BETTER. TRIED ALEVE, ICE, AND STRETCHING BUT NO HELP  Review of Systems  Constitutional: Positive for unexpected weight change.  Musculoskeletal: Positive for gait problem.  Hematological: Bruises/bleeds easily.       Objective:   Physical Exam I have reviewed her past history medications allergies surgery social history and review systems. Pulses are strongly palpable bilateral. Neurologic sensorium is intact for Semmes-Weinstein monofilament. Deep tendon reflexes are intact bilaterally muscle strength +5 over 5 dorsiflexors plantar flexors and inverters everters all Jones musculature is intact. Orthopedic evaluation demonstrates all joints distal to the ankle for range of motion without crepitation. Cutaneous evaluation demonstrates supple well-hydrated cutis. She has pain on palpation medial calcaneal tubercle of the both heel. Radiographs confirm soft tissue increase in density at the plantar fascial calcaneal insertion site.       Assessment & Plan:  Assessment: Plantar fasciitis bilateral  Plan: Discussed etiology pathology conservative versus surgical therapies. Injected the heel today with Kenalog and local anesthetic. Dispensed a plantar fascial brace and a night splint. Discussed the appropriate shoe gear stretching exercises ice therapy issue modifications. Dispensed a prescription for Medrol Dosepak to be followed by meloxicam. I will follow-up with her 1 month.

## 2015-01-15 ENCOUNTER — Telehealth: Payer: Self-pay | Admitting: *Deleted

## 2015-01-15 NOTE — Telephone Encounter (Addendum)
Pt states she was given Meloxicam and has a rash and terrible itching, what to do?  I informed pt to stop the Meloxicam, begin Benadryl OTC as package instructs, ice 3 - 4 times a day for 10-15 minutes, Tylenol as tolerated for the pain.  Pt states she has taken Ibuprofen without any relief, but was prescribed Celebrex and had relief.  I told her I would inform the doctor on call and callback.  Dr. Stacie AcresMayer ordered Diclofenac 75mg  #60 one tablet bid 1 refill.  I informed pt.

## 2015-01-16 ENCOUNTER — Telehealth: Payer: Self-pay | Admitting: *Deleted

## 2015-01-16 MED ORDER — DICLOFENAC SODIUM 75 MG PO TBEC: 75.0000 mg | DELAYED_RELEASE_TABLET | Freq: Two times a day (BID) | ORAL | Status: DC

## 2015-01-16 NOTE — Telephone Encounter (Signed)
Pt states the medication had not been called into the [facility] on [address].  Ordered the Diclofenac that I did not order 01/15/15 with 1 refill.

## 2015-01-18 ENCOUNTER — Telehealth: Payer: Self-pay | Admitting: *Deleted

## 2015-01-18 NOTE — Telephone Encounter (Signed)
[facility] informed our office of a duplication of therapy - Voltaren and Mobic.  I left a message stating I had reviewed her medications and that she should continue the Voltaren that was last ordered by Dr. Stacie AcresMayer and stop the Mobic prescribed by Dr. Al CorpusHyatt, and to call our office with confirmation of this information.

## 2015-01-31 ENCOUNTER — Ambulatory Visit (INDEPENDENT_AMBULATORY_CARE_PROVIDER_SITE_OTHER): Payer: BLUE CROSS/BLUE SHIELD | Admitting: Podiatry

## 2015-01-31 ENCOUNTER — Encounter: Payer: Self-pay | Admitting: Podiatry

## 2015-01-31 VITALS — BP 109/62 | HR 72 | Resp 15

## 2015-01-31 DIAGNOSIS — M722 Plantar fascial fibromatosis: Secondary | ICD-10-CM

## 2015-02-02 NOTE — Progress Notes (Signed)
She presents today for follow-up of bilateral plantar fasciitis. She states that her right foot feels great however her left foot still has some tenderness along the heel. She states that she continues CONSERVATIVE therapies.  Objective: Vital signs are stable she is alert and oriented 3 pulses are palpable pain. She has some residual pain on palpation medial calcaneal tubercle of the left heel minimally so on the right.  Assessment: Plantar fasciitis left greater than right.  Plan: Continue all conservative therapies reinjected her second dose of Kenalog left foot. Point of maximal tenderness of the left heel was injected. Follow-up with me in 1 month.

## 2015-02-28 ENCOUNTER — Ambulatory Visit: Payer: BLUE CROSS/BLUE SHIELD | Admitting: Podiatry

## 2015-03-05 ENCOUNTER — Encounter: Payer: Self-pay | Admitting: Podiatry

## 2015-03-05 ENCOUNTER — Ambulatory Visit (INDEPENDENT_AMBULATORY_CARE_PROVIDER_SITE_OTHER): Payer: BLUE CROSS/BLUE SHIELD | Admitting: Podiatry

## 2015-03-05 VITALS — BP 94/53 | HR 84 | Resp 16

## 2015-03-05 DIAGNOSIS — M722 Plantar fascial fibromatosis: Secondary | ICD-10-CM | POA: Diagnosis not present

## 2015-03-05 MED ORDER — DICLOFENAC SODIUM 75 MG PO TBEC: 75.0000 mg | DELAYED_RELEASE_TABLET | Freq: Two times a day (BID) | ORAL | Status: DC

## 2015-03-05 NOTE — Progress Notes (Signed)
She presents today for follow-up of plantar fasciitis bilateral. She states that currently she is performing all of her prescribed therapies consisting of oral anti-inflammatory braces during the day and at night. Shoe gear changes and icing. She states that she is a proximally 90% improved.  Objective: 64 year old female in no acute distress pulses are palpable bilateral vital signs are stable alert and oriented 3 neurologic sensorium is intact. Deep tendon reflexes are intact bilateral muscle strength is 5 over 5 dorsiflexion plantar flexors and inverters everters all intrinsic musculature is intact. Orthopedic evaluation of Mr. Tolle joints distal to the ankle for range of motion without crepitation. Cutaneous evaluation of a straight supple well-hydrated cutis no erythema edema saline as drainage or odor. She has pain on palpation medial calcaneal tubercles but much decreased.  Assessment: Well-healing plantar fasciitis bilateral.  Plan: Continue all conservative therapies consisting of braces night splint oral anti-inflammatory's and ice shoe gear change.  Arbutus Ped DPM

## 2016-05-12 ENCOUNTER — Ambulatory Visit (INDEPENDENT_AMBULATORY_CARE_PROVIDER_SITE_OTHER): Payer: BLUE CROSS/BLUE SHIELD | Admitting: Women's Health

## 2016-05-12 ENCOUNTER — Encounter: Payer: Self-pay | Admitting: Women's Health

## 2016-05-12 VITALS — BP 126/70 | Ht 62.0 in | Wt 158.0 lb

## 2016-05-12 DIAGNOSIS — Z01419 Encounter for gynecological examination (general) (routine) without abnormal findings: Secondary | ICD-10-CM

## 2016-05-12 DIAGNOSIS — N760 Acute vaginitis: Secondary | ICD-10-CM | POA: Diagnosis not present

## 2016-05-12 DIAGNOSIS — N898 Other specified noninflammatory disorders of vagina: Secondary | ICD-10-CM | POA: Diagnosis not present

## 2016-05-12 DIAGNOSIS — Z13820 Encounter for screening for osteoporosis: Secondary | ICD-10-CM | POA: Diagnosis not present

## 2016-05-12 DIAGNOSIS — B9689 Other specified bacterial agents as the cause of diseases classified elsewhere: Secondary | ICD-10-CM | POA: Diagnosis not present

## 2016-05-12 DIAGNOSIS — N952 Postmenopausal atrophic vaginitis: Secondary | ICD-10-CM

## 2016-05-12 LAB — WET PREP FOR TRICH, YEAST, CLUE
Trich, Wet Prep: NONE SEEN
Yeast Wet Prep HPF POC: NONE SEEN

## 2016-05-12 MED ORDER — ESTRADIOL 10 MCG VA TABS: ORAL_TABLET | VAGINAL | 12 refills | Status: DC

## 2016-05-12 MED ORDER — METRONIDAZOLE 0.75 % VA GEL: VAGINAL | 0 refills | Status: DC

## 2016-05-12 NOTE — Progress Notes (Signed)
Yvette Graham May 22, 1951 [id number]    History:    Presents for annual exam. Normal mammogram history, [facility]. 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 Hypertension diabetes on insulin, hypercholesteremia primary care manages. Has not had a screening colonoscopy. Has had Zostavax, Pneumovax and annual flu shot.  Past medical history, past surgical history, family history and social history were all reviewed and documented in the EPIC chart. Retired. 3 daughters all doing well.  ROS:  A ROS was performed and pertinent positives and negatives are included.  Exam:  Vitals:   05/12/16 1200  BP: 126/70  Weight: 158 lb (71.7 kg)  Height: 5\' 2"  (1.575 m)   Body mass index is 28.9 kg/m.   General appearance:  Normal Thyroid:  Symmetrical, normal in size, without palpable masses or nodularity. Respiratory  Auscultation:  Clear without wheezing or rhonchi Cardiovascular  Auscultation:  Regular rate, without rubs, murmurs or gallops  Edema/varicosities:  Not grossly evident Abdominal  Soft,nontender, without masses, guarding or rebound.  Liver/spleen:  No organomegaly noted  Hernia:  None appreciated  Skin  Inspection:  Grossly normal   Breasts: Examined lying and sitting.     Right: Without masses, retractions, discharge or axillary adenopathy.     Left: Without masses, retractions, discharge or axillary adenopathy. Gentitourinary   Inguinal/mons:  Normal without inguinal adenopathy  External genitalia:  Normal  BUS/Urethra/Skene's glands:  Normal  Vagina:  Mild atrophy, wet prep positive for few clues, TNTC bacteria  Cervix:  Normal  Uterus:   normal in size, shape and contour.  Midline and mobile  Adnexa/parametria:     Rt: Without masses or tenderness.   Lt: Without masses or tenderness.  Anus and perineum: Normal  Digital rectal exam: Normal sphincter tone without palpated masses or tenderness  Assessment/Plan:   65 y.o. MWF G3 P3 for annual exam complaint of vaginal irritation.  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  Hypertension/diabetes on insulin/hypercholesterolemia managed by primary care Bacteria vaginosis Vaginal atrophy Smoker  Plan: MetroGel vaginal cream 1 applicator at bedtime 5, alcohol precautions reviewed. After completing MetroGel start Vagifem 1 applicator at bedtime for 2 weeks vaginally and then twice weekly thereafter. Reviewed minimal systemic absorption. Continue vaginal lubricants with intercourse. SBE's, annual screening mammogram, calcium rich diet, vitamin D 1000 daily encouraged. Aware of hazards of smoking trying to cut down. Strongly encouraged screening colonoscopy. DEXA, instructed to schedule. Home safety, fall prevention and importance of weightbearing exercise reviewed. UA, Pap with HR HPV typing    Harrington ChallengerYOUNG,Yvette Graham WHNP, 1:14 PM 05/12/2016

## 2016-05-12 NOTE — Patient Instructions (Signed)

## 2016-05-13 LAB — URINALYSIS W MICROSCOPIC + REFLEX CULTURE
BILIRUBIN URINE: NEGATIVE
Bacteria, UA: NONE SEEN [HPF]
Casts: NONE SEEN [LPF]
Crystals: NONE SEEN [HPF]
Hgb urine dipstick: NEGATIVE
Ketones, ur: NEGATIVE
LEUKOCYTES UA: NEGATIVE
Nitrite: NEGATIVE
Protein, ur: NEGATIVE
RBC / HPF: NONE SEEN RBC/HPF (ref <=2)
SPECIFIC GRAVITY, URINE: 1.04 — AB (ref 1.001–1.035)
Squamous Epithelial / LPF: NONE SEEN [HPF] (ref <=5)
YEAST: NONE SEEN [HPF]
pH: 5.5 (ref 5.0–8.0)

## 2016-05-14 LAB — URINE CULTURE

## 2016-05-15 LAB — PAP IG W/ RFLX HPV ASCU

## 2016-05-27 ENCOUNTER — Other Ambulatory Visit: Payer: Self-pay | Admitting: Women's Health

## 2016-05-27 ENCOUNTER — Telehealth: Payer: Self-pay

## 2016-05-27 MED ORDER — FLUCONAZOLE 150 MG PO TABS: 150.0000 mg | ORAL_TABLET | Freq: Once | ORAL | 1 refills | Status: AC

## 2016-05-27 NOTE — Telephone Encounter (Signed)
Telephone call, states vaginal irritation and itching was better after MetroGel, will try Diflucan 150 times one dose E scribed to pharmacy. Instructed to call if no relief. Denies odor.

## 2016-05-27 NOTE — Telephone Encounter (Signed)
Patient called for results from her annual exam. Informed normal with exception of 3+ glucose in urine. She said she is on diabetic med that causes her to pass glucose in her urine and that is normal for her.  She also c/o vag itching returned 2 days after finishing 5 nights of Metrogel. She said she still has some left in tube and wondered if she should use more?

## 2016-11-18 ENCOUNTER — Encounter: Payer: Self-pay | Admitting: Gynecology

## 2018-05-19 ENCOUNTER — Telehealth: Payer: Self-pay | Admitting: *Deleted

## 2018-05-19 DIAGNOSIS — Z13820 Encounter for screening for osteoporosis: Secondary | ICD-10-CM

## 2018-05-19 NOTE — Telephone Encounter (Signed)
-----   Message from Jerilynn Mageslaudia Shaffer sent at 05/19/2018 12:36 PM EST ----- Regarding: bone density Yvette Graham told patient to schedule bone density 2017 and she is scheduling it now can you please enter order. Thx

## 2018-05-19 NOTE — Telephone Encounter (Signed)
Order placed

## 2018-05-19 NOTE — Telephone Encounter (Signed)
Yvette Graham patient last seen in 05/2016 was told to schedule dexa, but never did. She schedule for annual exam with you on 07/20/2018. Okay to schedule dexa now or wait until annual exam?

## 2018-05-19 NOTE — Telephone Encounter (Signed)
Yes ok to have her schedule, order under Dr Audie BoxFontaine as provider   thanks

## 2018-06-05 DIAGNOSIS — M8580 Other specified disorders of bone density and structure, unspecified site: Secondary | ICD-10-CM

## 2018-06-05 HISTORY — DX: Other specified disorders of bone density and structure, unspecified site: M85.80

## 2018-06-13 ENCOUNTER — Encounter: Payer: Self-pay | Admitting: Gynecology

## 2018-06-13 ENCOUNTER — Ambulatory Visit (INDEPENDENT_AMBULATORY_CARE_PROVIDER_SITE_OTHER): Payer: BLUE CROSS/BLUE SHIELD

## 2018-06-13 ENCOUNTER — Other Ambulatory Visit: Payer: Self-pay | Admitting: Gynecology

## 2018-06-13 DIAGNOSIS — Z13820 Encounter for screening for osteoporosis: Secondary | ICD-10-CM

## 2018-06-13 DIAGNOSIS — M8589 Other specified disorders of bone density and structure, multiple sites: Secondary | ICD-10-CM

## 2018-07-20 ENCOUNTER — Encounter: Payer: Self-pay | Admitting: Women's Health

## 2018-07-20 ENCOUNTER — Ambulatory Visit (INDEPENDENT_AMBULATORY_CARE_PROVIDER_SITE_OTHER): Payer: BLUE CROSS/BLUE SHIELD | Admitting: Women's Health

## 2018-07-20 VITALS — BP 118/78 | Ht 62.0 in | Wt 150.0 lb

## 2018-07-20 DIAGNOSIS — Z113 Encounter for screening for infections with a predominantly sexual mode of transmission: Secondary | ICD-10-CM | POA: Diagnosis not present

## 2018-07-20 DIAGNOSIS — Z01419 Encounter for gynecological examination (general) (routine) without abnormal findings: Secondary | ICD-10-CM | POA: Diagnosis not present

## 2018-07-20 NOTE — Progress Notes (Signed)
Yvette Graham 1951-01-17 [id number]    History:    Presents for annual exam.  Postmenopausal on no HRT with no bleeding.  2015 normal with positive  with high risk HPV, LGSIL on biopsy.  Has had annual mammograms at [facility].  Vaccines current but has not had new Shingrex.  06/2018 T score -1.7 at left femoral neck FRAX 9.9% / 2.1%.  Primary care manages hypertension, diabetes on insulin, hypercholesteremia.  1 benign colon polyp 03/2018 Dr. Loreta Graham.  Husband of 31 years recently left for another woman from [place].  Past medical history, past surgical history, family history and social history were all reviewed and documented in the EPIC chart.  Has 3 daughters all doing well.  ROS:  A ROS was performed and pertinent positives and negatives are included.  Exam:  Vitals:   07/20/18 1054  BP: 118/78  Weight: 150 lb (68 kg)  Height: 5\' 2"  (1.575 m)   Body mass index is 27.44 kg/m.   General appearance:  Normal Thyroid:  Symmetrical, normal in size, without palpable masses or nodularity. Respiratory  Auscultation:  Clear without wheezing or rhonchi Cardiovascular  Auscultation:  Regular rate, without rubs, murmurs or gallops  Edema/varicosities:  Not grossly evident Abdominal  Soft,nontender, without masses, guarding or rebound.  Liver/spleen:  No organomegaly noted  Hernia:  None appreciated  Skin  Inspection:  Grossly normal   Breasts: Examined lying and sitting.     Right: Without masses, retractions, discharge or axillary adenopathy.     Left: Without masses, retractions, discharge or axillary adenopathy. Gentitourinary   Inguinal/mons:  Normal without inguinal adenopathy  External genitalia:  Normal  BUS/Urethra/Skene's glands:  Normal  Vagina:  Normal  Cervix:  Normal  Uterus:   normal in size, shape and contour.  Midline and mobile  Adnexa/parametria:     Rt: Without masses or tenderness.   Lt: Without masses or tenderness.  Anus and perineum: Normal  Digital rectal  exam: Normal sphincter tone without palpated masses or tenderness  Assessment/Plan:  68 y.o. separated WF G3, P3 for annual exam.  Postmenopausal/no HRT/no bleeding STD screen 2015 LGSIL with positive HR HPV, normal Pap 2017 Osteopenia without elevated FRAX Hypertension, diabetes/hypercholesteremia primary care manages labs and meds  Plan: Encouraged counseling for recent separation, leisure activities.  Condolences given.  Husband with erectile dysfunction denies need for HIV, hepatitis or RPR will check GC/chlamydia.  SBEs, continue annual screening mammogram instructed to have Yvette Graham sent to our office.  Home safety, fall prevention and importance of weightbearing and balance type exercise encouraged.  Pap with HR HPV typing.    Yvette Graham, 11:39 AM 07/20/2018

## 2018-07-20 NOTE — Patient Instructions (Signed)
shingrex   Health Maintenance After Age 68 After age 68, you are at a higher risk for certain long-term diseases and infections as well as injuries from falls. Falls are a major cause of broken bones and head injuries in people who are older than age 68. Getting regular preventive care can help to keep you healthy and well. Preventive care includes getting regular testing and making lifestyle changes as recommended by your health care provider. Talk with your health care provider about:  Which screenings and tests you should have. A screening is a test that checks for a disease when you have no symptoms.  A diet and exercise plan that is right for you. What should I know about screenings and tests to prevent falls? Screening and testing are the best ways to find a health problem early. Early diagnosis and treatment give you the best chance of managing medical conditions that are common after age 68. Certain conditions and lifestyle choices may make you more likely to have a fall. Your health care provider may recommend:  Regular vision checks. Poor vision and conditions such as cataracts can make you more likely to have a fall. If you wear glasses, make sure to get your prescription updated if your vision changes.  Medicine review. Work with your health care provider to regularly review all of the medicines you are taking, including over-the-counter medicines. Ask your health care provider about any side effects that may make you more likely to have a fall. Tell your health care provider if any medicines that you take make you feel dizzy or sleepy.  Osteoporosis screening. Osteoporosis is a condition that causes the bones to get weaker. This can make the bones weak and cause them to break more easily.  Blood pressure screening. Blood pressure changes and medicines to control blood pressure can make you feel dizzy.  Strength and balance checks. Your health care provider may recommend certain tests  to check your strength and balance while standing, walking, or changing positions.  Foot health exam. Foot pain and numbness, as well as not wearing proper footwear, can make you more likely to have a fall.  Depression screening. You may be more likely to have a fall if you have a fear of falling, feel emotionally low, or feel unable to do activities that you used to do.  Alcohol use screening. Using too much alcohol can affect your balance and may make you more likely to have a fall. What actions can I take to lower my risk of falls? General instructions  Talk with your health care provider about your risks for falling. Tell your health care provider if: ? You fall. Be sure to tell your health care provider about all falls, even ones that seem minor. ? You feel dizzy, sleepy, or off-balance.  Take over-the-counter and prescription medicines only as told by your health care provider. These include any supplements.  Eat a healthy diet and maintain a healthy weight. A healthy diet includes low-fat dairy products, low-fat (lean) meats, and fiber from whole grains, beans, and lots of fruits and vegetables. Home safety  Remove any tripping hazards, such as rugs, cords, and clutter.  Install safety equipment such as grab bars in bathrooms and safety rails on stairs.  Keep rooms and walkways well-lit. Activity   Follow a regular exercise program to stay fit. This will help you maintain your balance. Ask your health care provider what types of exercise are appropriate for you.  If you need   a cane or walker, use it as recommended by your health care provider.  Wear supportive shoes that have nonskid soles. Lifestyle  Do not drink alcohol if your health care provider tells you not to drink.  If you drink alcohol, limit how much you have: ? 0-1 drink a day for women. ? 0-2 drinks a day for men.  Be aware of how much alcohol is in your drink. In the [place] one drink equals one typical  bottle of beer (12 oz), one-half glass of wine (5 oz), or one shot of hard liquor (1 oz).  Do not use any products that contain nicotine or tobacco, such as cigarettes and e-cigarettes. If you need help quitting, ask your health care provider. Summary  Having a healthy lifestyle and getting preventive care can help to protect your health and wellness after age 68.  Screening and testing are the best way to find a health problem early and help you avoid having a fall. Early diagnosis and treatment give you the best chance for managing medical conditions that are more common for people who are older than age 68.  Falls are a major cause of broken bones and head injuries in people who are older than age 68. Take precautions to prevent a fall at home.  Work with your health care provider to learn what changes you can make to improve your health and wellness and to prevent falls. This information is not intended to replace advice given to you by your health care provider. Make sure you discuss any questions you have with your health care provider. Document Released: 05/05/2017 Document Revised: 05/05/2017 Document Reviewed: 05/05/2017 Elsevier Interactive Patient Education  2019 Elsevier Inc.  

## 2018-07-20 NOTE — Addendum Note (Signed)
Addended by: Tito Dine on: 07/20/2018 12:21 PM   Modules accepted: Orders

## 2018-07-21 LAB — C. TRACHOMATIS/N. GONORRHOEAE RNA
C. trachomatis RNA, TMA: NOT DETECTED
N. gonorrhoeae RNA, TMA: NOT DETECTED

## 2018-07-22 LAB — PAP, TP IMAGING W/ HPV RNA, RFLX HPV TYPE 16,18/45: HPV DNA HIGH RISK: NOT DETECTED

## 2018-08-10 ENCOUNTER — Telehealth: Payer: Self-pay | Admitting: *Deleted

## 2018-08-10 NOTE — Telephone Encounter (Signed)
Patient informed with pap and GC.CHLAM results from OV on 07/20/18.

## 2018-08-16 ENCOUNTER — Encounter: Payer: Self-pay | Admitting: Women's Health

## 2019-01-20 ENCOUNTER — Encounter (INDEPENDENT_AMBULATORY_CARE_PROVIDER_SITE_OTHER): Payer: BLUE CROSS/BLUE SHIELD | Admitting: Ophthalmology

## 2019-01-20 ENCOUNTER — Other Ambulatory Visit: Payer: Self-pay

## 2019-01-20 DIAGNOSIS — H43813 Vitreous degeneration, bilateral: Secondary | ICD-10-CM

## 2019-01-20 DIAGNOSIS — H35033 Hypertensive retinopathy, bilateral: Secondary | ICD-10-CM | POA: Diagnosis not present

## 2019-01-20 DIAGNOSIS — H33301 Unspecified retinal break, right eye: Secondary | ICD-10-CM | POA: Diagnosis not present

## 2019-01-20 DIAGNOSIS — I10 Essential (primary) hypertension: Secondary | ICD-10-CM | POA: Diagnosis not present

## 2019-01-20 DIAGNOSIS — H33022 Retinal detachment with multiple breaks, left eye: Secondary | ICD-10-CM

## 2019-01-20 DIAGNOSIS — H2513 Age-related nuclear cataract, bilateral: Secondary | ICD-10-CM

## 2019-01-26 ENCOUNTER — Other Ambulatory Visit: Payer: Self-pay

## 2019-01-26 ENCOUNTER — Encounter (INDEPENDENT_AMBULATORY_CARE_PROVIDER_SITE_OTHER): Payer: BLUE CROSS/BLUE SHIELD | Admitting: Ophthalmology

## 2019-01-26 DIAGNOSIS — H33301 Unspecified retinal break, right eye: Secondary | ICD-10-CM

## 2019-01-26 DIAGNOSIS — H33022 Retinal detachment with multiple breaks, left eye: Secondary | ICD-10-CM

## 2019-02-07 ENCOUNTER — Encounter (INDEPENDENT_AMBULATORY_CARE_PROVIDER_SITE_OTHER): Payer: BLUE CROSS/BLUE SHIELD | Admitting: Ophthalmology

## 2019-02-07 ENCOUNTER — Other Ambulatory Visit: Payer: Self-pay

## 2019-02-07 DIAGNOSIS — H33303 Unspecified retinal break, bilateral: Secondary | ICD-10-CM

## 2019-04-12 ENCOUNTER — Encounter: Payer: Self-pay | Admitting: Gynecology

## 2019-06-09 ENCOUNTER — Encounter (INDEPENDENT_AMBULATORY_CARE_PROVIDER_SITE_OTHER): Payer: BLUE CROSS/BLUE SHIELD | Admitting: Ophthalmology

## 2019-06-09 ENCOUNTER — Other Ambulatory Visit: Payer: Self-pay

## 2019-06-09 DIAGNOSIS — I10 Essential (primary) hypertension: Secondary | ICD-10-CM | POA: Diagnosis not present

## 2019-06-09 DIAGNOSIS — H33303 Unspecified retinal break, bilateral: Secondary | ICD-10-CM | POA: Diagnosis not present

## 2019-06-09 DIAGNOSIS — H353112 Nonexudative age-related macular degeneration, right eye, intermediate dry stage: Secondary | ICD-10-CM

## 2019-06-09 DIAGNOSIS — H43813 Vitreous degeneration, bilateral: Secondary | ICD-10-CM

## 2019-06-09 DIAGNOSIS — H35033 Hypertensive retinopathy, bilateral: Secondary | ICD-10-CM

## 2019-11-13 ENCOUNTER — Encounter: Payer: Self-pay | Admitting: Women's Health

## 2020-06-12 ENCOUNTER — Encounter (INDEPENDENT_AMBULATORY_CARE_PROVIDER_SITE_OTHER): Payer: BLUE CROSS/BLUE SHIELD | Admitting: Ophthalmology

## 2020-06-18 ENCOUNTER — Encounter: Payer: Self-pay | Admitting: Podiatry

## 2020-06-18 ENCOUNTER — Other Ambulatory Visit: Payer: Self-pay

## 2020-06-18 ENCOUNTER — Ambulatory Visit: Payer: BLUE CROSS/BLUE SHIELD

## 2020-06-18 ENCOUNTER — Ambulatory Visit (INDEPENDENT_AMBULATORY_CARE_PROVIDER_SITE_OTHER): Payer: BLUE CROSS/BLUE SHIELD | Admitting: Podiatry

## 2020-06-18 DIAGNOSIS — Z1211 Encounter for screening for malignant neoplasm of colon: Secondary | ICD-10-CM | POA: Insufficient documentation

## 2020-06-18 DIAGNOSIS — Q828 Other specified congenital malformations of skin: Secondary | ICD-10-CM | POA: Diagnosis not present

## 2020-06-18 DIAGNOSIS — M2041 Other hammer toe(s) (acquired), right foot: Secondary | ICD-10-CM

## 2020-06-18 DIAGNOSIS — R195 Other fecal abnormalities: Secondary | ICD-10-CM | POA: Insufficient documentation

## 2020-06-18 NOTE — Progress Notes (Signed)
Subjective:  Patient ID: Yvette Graham, female    DOB: 07-02-1951,  MRN: [id number] HPI Chief Complaint  Patient presents with  . Toe Pain    2nd toe right - callused area x 4-5 months, PCP eval at physical appt-said wasn't a wart but needed to be checked  . New Patient (Initial Visit)    Est pt 2016    69 y.o. female presents with the above complaint.   ROS: Denies fever chills nausea vomiting muscle aches pains calf pain back pain chest pain shortness of breath.  Past Medical History:  Diagnosis Date  . Complication of anesthesia   . Depression 1995   "after sister died"  . Hypercholesteremia   . Hypertension   . Kidney stones 2012  . Osteopenia 06/2018   T score -1.7 FRAX 9.9% / 2.1%  . Peripheral vascular disease (HCC)   . PONV (postoperative nausea and vomiting)   . Type II diabetes mellitus (HCC)    Past Surgical History:  Procedure Laterality Date  . ANTERIOR CERVICAL DECOMP/DISCECTOMY FUSION  1997  . APPENDECTOMY  1963  . BREAST BIOPSY  1981   left  . BREAST CYST EXCISION  1981   left  . DILATION AND CURETTAGE OF UTERUS    . LITHOTRIPSY  2012  . TONSILLECTOMY AND ADENOIDECTOMY  1953    Current Outpatient Medications:  .  albuterol (VENTOLIN HFA) 108 (90 Base) MCG/ACT inhaler, Inhale into the lungs., Disp: , Rfl:  .  Aspirin 81 MG CAPS, , Disp: , Rfl:  .  BD PEN NEEDLE NANO 2ND GEN 32G X 4 MM MISC, , Disp: , Rfl:  .  Continuous Blood Gluc Sensor (FREESTYLE LIBRE 14 DAY SENSOR) MISC, SMARTSIG:1 Each Topical Every 2 Weeks, Disp: , Rfl:  .  dapagliflozin propanediol (FARXIGA) 10 MG TABS tablet, , Disp: , Rfl:  .  ezetimibe-simvastatin (VYTORIN) 10-40 MG per tablet, Take 1 tablet by mouth at bedtime., Disp: , Rfl:  .  fluticasone (FLONASE) 50 MCG/ACT nasal spray, Place into both nostrils., Disp: , Rfl:  .  HUMALOG 100 UNIT/ML injection, USE IN V-GO 20 DEVICE UP TO 56 UNITS DAILY (ALLERGIC TO NOVOLOG), Disp: , Rfl: 6 .  Insulin Degludec (TRESIBA) 100 UNIT/ML  SOLN, , Disp: , Rfl:  .  lisinopril (PRINIVIL,ZESTRIL) 10 MG tablet, Take 10 mg by mouth at bedtime., Disp: , Rfl:  .  prednisoLONE acetate (PRED FORTE) 1 % ophthalmic suspension, SMARTSIG:In Eye(s), Disp: , Rfl:   Allergies  Allergen Reactions  . Codeine Shortness Of Breath  . Contrast Media [Iodinated Diagnostic Agents] Anaphylaxis and Itching  . Meloxicam Itching  . Other     Other reaction(s): Unknown   Review of Systems Objective:  There were no vitals filed for this visit.  General: Well developed, nourished, in no acute distress, alert and oriented x3   Dermatological: Skin is warm, dry and supple bilateral. Nails x 10 are well maintained; remaining integument appears unremarkable at this time. There are no open sores, no preulcerative lesions, no rash or signs of infection present.  Porokeratotic lesion cornified dorsal medial aspect DIPJ right.  Vascular: Dorsalis Pedis artery and Posterior Tibial artery pedal pulses are 2/4 bilateral with immedate capillary fill time. Pedal hair growth present. No varicosities and no lower extremity edema present bilateral.   Neruologic: Grossly intact via light touch bilateral. Vibratory intact via tuning fork bilateral. Protective threshold with Semmes Wienstein monofilament intact to all pedal sites bilateral. Patellar and Achilles deep tendon reflexes 2+  bilateral. No Babinski or clonus noted bilateral.   Musculoskeletal: No gross boney pedal deformities bilateral. No pain, crepitus, or limitation noted with foot and ankle range of motion bilateral. Muscular strength 5/5 in all groups tested bilateral.  Osteoarthritic change changes and spurring medial aspect of the DIPJ second digit right foot with hallux interphalangeal resulting in juxtaposition irritation.  Gait: Unassisted, Nonantalgic.    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  Plan: Debridement of reactive hyperkeratotic tissue today she will follow up with [place] on an as-needed basis provided her with various alternatives of padding.     Katessa Attridge T. [place], [place]

## 2021-06-03 ENCOUNTER — Ambulatory Visit: Payer: Self-pay | Admitting: Podiatry

## 2023-08-26 ENCOUNTER — Ambulatory Visit: Payer: Self-pay | Admitting: Podiatry

## 2023-08-26 DIAGNOSIS — M2060 Acquired deformities of toe(s), unspecified, unspecified foot: Secondary | ICD-10-CM

## 2023-11-15 ENCOUNTER — Other Ambulatory Visit: Payer: Self-pay

## 2023-11-15 DIAGNOSIS — I872 Venous insufficiency (chronic) (peripheral): Secondary | ICD-10-CM

## 2023-11-25 ENCOUNTER — Ambulatory Visit (HOSPITAL_COMMUNITY)
Admission: RE | Admit: 2023-11-25 | Discharge: 2023-11-25 | Disposition: A | Discharge status: Home / Self Care | Payer: Self-pay | Source: Ambulatory Visit | Attending: Vascular Surgery | Admitting: Vascular Surgery

## 2023-11-25 DIAGNOSIS — I872 Venous insufficiency (chronic) (peripheral): Secondary | ICD-10-CM | POA: Diagnosis present

## 2023-12-23 ENCOUNTER — Ambulatory Visit: Payer: Self-pay | Attending: Vascular Surgery | Admitting: Physician Assistant

## 2023-12-23 ENCOUNTER — Encounter: Payer: Self-pay | Admitting: Physician Assistant

## 2023-12-23 VITALS — BP 128/82 | HR 77 | Temp 93.0°F

## 2023-12-23 DIAGNOSIS — M7989 Other specified soft tissue disorders: Secondary | ICD-10-CM

## 2023-12-23 DIAGNOSIS — F17200 Nicotine dependence, unspecified, uncomplicated: Secondary | ICD-10-CM

## 2023-12-23 NOTE — Progress Notes (Signed)
 [facility]           [place]  History and Physical   Yvette Graham is a 73 y.o. female who presents with BLE swelling.  She states that she started having leg swelling back in April before her cruise.  She was having swelling in the left leg then having leg swelling in the right leg.  She states the swelling was down onto her foot bilaterally.  She states that Dr. Tisovec started her on diuretic and this has helped.  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  When she is not up working, she does elevate her legs.  She states that the swelling has improved and gets better by morning time after being in bed.  She does currently wear compression as she states it was too tight around the knee and folded down and the thigh high was too hard to wear with pants.  Also, they are too hot in the summer time.  She states that she also has some swelling around her knees.   She does not have hx of DVT.  She does have family hx of varicose veins with her mother. She does not have skin color changes.  She does have some superficial spider veins that have not ever bled.    She has hx of HTN, DM.  She does smoke about 5 cigarettes per day.   The pt is on a statin for cholesterol management.  The pt is on a daily aspirin .   Other AC:  none The pt is on ACEI for hypertension.   The pt is  on medication for diabetes.   Tobacco hx:  current  Pt does not have family hx of AAA.  Past Medical History:  Diagnosis Date   Complication of anesthesia    Depression 01/12/94   after sister died   Hypercholesteremia    Hypertension    Kidney stones 2011-01-13   Osteopenia 06/2018   T score -1.7 FRAX 9.9% / 2.1%   Peripheral vascular disease (HCC)    PONV (postoperative nausea and vomiting)    Type II diabetes mellitus (HCC)     Past Surgical History:  Procedure Laterality Date   ANTERIOR CERVICAL  DECOMP/DISCECTOMY FUSION  1997   APPENDECTOMY  1963   BREAST BIOPSY  1981   left   BREAST CYST EXCISION  1981   left   DILATION AND CURETTAGE OF UTERUS     LITHOTRIPSY  01/13/11   TONSILLECTOMY AND ADENOIDECTOMY  1953    Social History   Socioeconomic History   Marital status: Married    Spouse name: Not on file   Number of children: Not on file   Years of education: Not on file   Highest education level: Not on file  Occupational History   Not on file  Tobacco Use   Smoking status: Every Day    Current packs/day: 0.50    Average packs/day: 0.5 packs/day for 20.0 years (10.0 ttl pk-yrs)    Types: Cigarettes   Smokeless tobacco: Never  Substance and Sexual Activity   Alcohol use: Yes    Alcohol/week: 1.0 standard drink of alcohol    Types: 1 Glasses of wine per week   Drug use: No   Sexual activity: Yes  Other Topics Concern   Not on file  Social  History Narrative   Not on file   Social Drivers of Health   Financial Resource Strain: Not on file  Food Insecurity: Not on file  Transportation Needs: Not on file  Physical Activity: Not on file  Stress: Not on file  Social Connections: Not on file  Intimate Partner Violence: Not on file    No family hx of AAA. + family hx of cancer.   Current Outpatient Medications  Medication Sig Dispense Refill   albuterol (VENTOLIN HFA) 108 (90 Base) MCG/ACT inhaler Inhale into the lungs.     Aspirin  81 MG CAPS      BD PEN NEEDLE NANO 2ND GEN 32G X 4 MM MISC      Continuous Blood Gluc Sensor (FREESTYLE LIBRE 14 DAY SENSOR) MISC SMARTSIG:1 Each Topical Every 2 Weeks     dapagliflozin propanediol (FARXIGA) 10 MG TABS tablet      ezetimibe -simvastatin  (VYTORIN ) 10-40 MG per tablet Take 1 tablet by mouth at bedtime.     fluticasone (FLONASE) 50 MCG/ACT nasal spray Place into both nostrils.     HUMALOG 100 UNIT/ML injection USE IN V-GO 20 DEVICE UP TO 56 UNITS DAILY (ALLERGIC TO NOVOLOG )  6   Insulin  Degludec (TRESIBA) 100 UNIT/ML  SOLN      lisinopril  (PRINIVIL ,ZESTRIL ) 10 MG tablet Take 10 mg by mouth at bedtime.     prednisoLONE acetate (PRED FORTE) 1 % ophthalmic suspension SMARTSIG:In Eye(s)     No current facility-administered medications for this visit.    Allergies  Allergen Reactions   Codeine Shortness Of Breath   Contrast Media [Iodinated Contrast Media] Anaphylaxis and Itching   Meloxicam  Itching   Other     Other reaction(s): Unknown    REVIEW OF SYSTEMS:   [X]  denotes positive finding, [ ]  denotes negative finding Cardiac  Comments:  Chest pain or chest pressure:    Shortness of breath upon exertion:    Short of breath when lying flat:    Irregular heart rhythm:        Vascular    Pain in calf, thigh, or hip brought on by ambulation:    Pain in feet at night that wakes you up from your sleep:     Blood clot in your veins:    Leg swelling:  x       Pulmonary    Oxygen at home:    Productive cough:     Wheezing:         Neurologic    Sudden weakness in arms or legs:     Sudden numbness in arms or legs:     Sudden onset of difficulty speaking or slurred speech:    Temporary loss of vision in one eye:     Problems with dizziness:         Gastrointestinal    Blood in stool:     Vomited blood:         Genitourinary    Burning when urinating:     Blood in urine:        Psychiatric    Major depression:         Hematologic    Bleeding problems:    Problems with blood clotting too easily:        Skin    Rashes or ulcers:        Constitutional    Fever or chills:      PHYSICAL EXAMINATION:  Today's Vitals   12/23/23 0933  BP: 128/82  Pulse:  77  Temp: (!) 93 F (33.9 C)  TempSrc: Temporal  SpO2: 95%  PainSc: 0-No pain   There is no height or weight on file to calculate BMI.   General:  WDWN in NAD; vital signs documented above Gait: Not observed HENT: WNL, normocephalic Pulmonary: normal non-labored breathing without wheezing Cardiac: regular HR; without  carotid bruits Abdomen: soft, NT, aortic pulse is not palpable Skin: without rashes Vascular Exam/Pulses:  Right Left  Radial 2+ (normal) 2+ (normal)  DP 2+ (normal) 2+ (normal)   Extremities: mild BLE swelling; + spider veins present bilaterally as pictured.   Right medial thigh   Right lateral leg   Right medial ankle   Left medial leg   Left medial ankle    Neurologic: A&O X 3;  moving all extremities equally Psychiatric:  The pt has Normal affect.   Non-Invasive Vascular Imaging:   Venous duplex on 11/25/2023: +--------------+---------+------+-----------+------------+--------+  RIGHT        Reflux NoRefluxReflux TimeDiameter cmsComments                          Yes                                   +--------------+---------+------+-----------+------------+--------+  CFV          no                                              +--------------+---------+------+-----------+------------+--------+  FV mid                  yes   >1 second                       +--------------+---------+------+-----------+------------+--------+  Popliteal    no                                              +--------------+---------+------+-----------+------------+--------+  GSV at [facility]    no                            0.51              +--------------+---------+------+-----------+------------+--------+  GSV prox thighno                            0.34              +--------------+---------+------+-----------+------------+--------+  GSV mid thigh no                            0.33              +--------------+---------+------+-----------+------------+--------+  GSV dist thigh          yes    >500 ms      0.26              +--------------+---------+------+-----------+------------+--------+  GSV at knee   no  0.23              +--------------+---------+------+-----------+------------+--------+   GSV prox calf           yes    >500 ms      0.2               +--------------+---------+------+-----------+------------+--------+  GSV mid calf  no                            0.2               +--------------+---------+------+-----------+------------+--------+  SSV Pop Fossa no                            0.48              +--------------+---------+------+-----------+------------+--------+  SSV prox calf no                            0.29              +--------------+---------+------+-----------+------------+--------+     +--------------+---------+------+-----------+------------+--------+  LEFT         Reflux NoRefluxReflux TimeDiameter cmsComments                          Yes                                   +--------------+---------+------+-----------+------------+--------+  CFV          no                                              +--------------+---------+------+-----------+------------+--------+  FV mid        no                                              +--------------+---------+------+-----------+------------+--------+  Popliteal    no                                              +--------------+---------+------+-----------+------------+--------+  GSV at [facility]    no                            0.78              +--------------+---------+------+-----------+------------+--------+  GSV prox thighno                            0.36              +--------------+---------+------+-----------+------------+--------+  GSV mid thigh no                            0.32              +--------------+---------+------+-----------+------------+--------+  GSV dist thighno  0.22              +--------------+---------+------+-----------+------------+--------+  GSV at knee   no                            0.2                +--------------+---------+------+-----------+------------+--------+  GSV prox calf no                            0.19              +--------------+---------+------+-----------+------------+--------+  SSV Pop Fossa no                            0.7               +--------------+---------+------+-----------+------------+--------+  SSV prox calf no                            0.3               +--------------+---------+------+-----------+------------+--------+     Summary:  Bilateral:  - No evidence of deep vein thrombosis seen in the lower extremities,  bilaterally, from the common femoral through the popliteal veins.  - No evidence of superficial venous thrombosis in the lower extremities,  bilaterally.    Right:  - No evidence of superficial venous reflux seen in the right short  saphenous vein.  - Venous reflux is noted in the right greater saphenous vein in the thigh.  - Venous reflux is noted in the right greater saphenous vein in the calf.  - Venous reflux is noted in the right femoral vein.    Left:  - There is no evidence of venous reflux seen in the left lower extremity.     Yvette Graham is a 73 y.o. female who presents with: BLE swelling    -pt has easily palpable DP pedal pulses bilaterally -pt does not have evidence of DVT.  On the right, the pt does have venous reflux in the femoral vein and the GSV in the distal thigh and proximal calf.  There is no reflux at the [facility].  On the left, she does not have any venous reflux.   -discussed with pt about wearing knee high 15-20 mmHg compression stockings and pt was measured for these today.    -discussed the importance of leg elevation and how to elevate properly - pt is advised to elevate their legs and a diagram is given to them to demonstrate for pt to lay flat on their back with knees elevated and slightly bent with their feet higher than their knees, which puts their feet higher than their heart for 15  minutes per day.  If pt cannot lay flat, advised to lay as flat as possible.  -pt is advised to continue as much walking as possible and avoid sitting or standing for long periods of time.  She stays very active throughout the day and elevates when she not up and about.  -discussed importance of maintaining a healthy weight and exercise and that water aerobics would also be beneficial.  -handout with recommendations given  -she states that the left leg is worse.  There is no reflux in the left leg.  I discussed that she could have a compression of the iliac  vein on the left and we could evaluate with CT venogram but since her swelling has improved, would hold off on that.  If swelling worsens, we can re-evaluate with CT venogram.  She expressed good understanding.  She does have some spider veins present on both legs.  We discussed sclerotherapy.  Will have our RN call to discuss this with her.  If she is interested in pursuing this, we will get her scheduled.   Current smoker-discussed the importance of smoking cessation and given she is diabetic it puts her at higher risk for limb loss in the future.  She is also at risk for MI, CVA, cancers.    -pt will f/u as needed.    Maryanna Smart, [facility] [phone]  Clinic MD:  Rosalva Comber

## 2023-12-27 ENCOUNTER — Telehealth: Payer: Self-pay

## 2023-12-27 NOTE — Telephone Encounter (Signed)
 Called pt after she was seen here last week to f/u on sclerotherapy. She is interested in doing this for cosmetic purposes around her knees. She is hoping 2-3 vials will suffice. She will call back after summer to schedule.

## 2024-03-28 ENCOUNTER — Other Ambulatory Visit: Payer: Self-pay | Admitting: Internal Medicine

## 2024-03-28 DIAGNOSIS — Z801 Family history of malignant neoplasm of trachea, bronchus and lung: Secondary | ICD-10-CM

## 2024-03-28 DIAGNOSIS — Z720 Tobacco use: Secondary | ICD-10-CM

## 2024-04-04 ENCOUNTER — Ambulatory Visit
Admission: RE | Admit: 2024-04-04 | Discharge: 2024-04-04 | Disposition: A | Discharge status: Home / Self Care | Payer: PRIVATE HEALTH INSURANCE | Source: Ambulatory Visit | Attending: Internal Medicine | Admitting: Internal Medicine

## 2024-04-04 DIAGNOSIS — Z720 Tobacco use: Secondary | ICD-10-CM

## 2024-04-04 DIAGNOSIS — Z801 Family history of malignant neoplasm of trachea, bronchus and lung: Secondary | ICD-10-CM

## 2024-09-05 ENCOUNTER — Encounter (HOSPITAL_BASED_OUTPATIENT_CLINIC_OR_DEPARTMENT_OTHER): Payer: PRIVATE HEALTH INSURANCE | Admitting: Internal Medicine
